# Patient Record
Sex: Female | Born: 1935 | Race: White | Hispanic: No | Smoking: Never smoker
Health system: Southern US, Community
[De-identification: ages and names within clinical notes are randomized; demographics above are authoritative.]

## PROBLEM LIST (undated history)

## (undated) DIAGNOSIS — E7800 Pure hypercholesterolemia, unspecified: Secondary | ICD-10-CM

## (undated) DIAGNOSIS — I10 Essential (primary) hypertension: Secondary | ICD-10-CM

## (undated) DIAGNOSIS — Z981 Arthrodesis status: Secondary | ICD-10-CM

## (undated) DIAGNOSIS — M719 Bursopathy, unspecified: Secondary | ICD-10-CM

## (undated) HISTORY — PX: TOTAL VAGINAL HYSTERECTOMY: SHX2548

## (undated) HISTORY — PX: APPENDECTOMY: SHX54

## (undated) HISTORY — PX: SPINAL FUSION: SHX223

## (undated) HISTORY — PX: CHOLECYSTECTOMY: SHX55

## (undated) HISTORY — PX: TONSILLECTOMY: SUR1361

---

## 2003-02-17 ENCOUNTER — Encounter: Payer: Self-pay | Admitting: Neurosurgery

## 2003-02-17 ENCOUNTER — Encounter: Admission: RE | Admit: 2003-02-17 | Discharge: 2003-02-17 | Payer: Self-pay | Admitting: Neurosurgery

## 2003-06-02 ENCOUNTER — Encounter: Admission: RE | Admit: 2003-06-02 | Discharge: 2003-06-02 | Payer: Self-pay | Admitting: Neurosurgery

## 2003-06-16 ENCOUNTER — Encounter: Admission: RE | Admit: 2003-06-16 | Discharge: 2003-06-16 | Payer: Self-pay | Admitting: Neurosurgery

## 2003-10-06 ENCOUNTER — Encounter: Admission: RE | Admit: 2003-10-06 | Discharge: 2003-10-06 | Payer: Self-pay | Admitting: Neurosurgery

## 2004-12-31 ENCOUNTER — Ambulatory Visit: Payer: Self-pay

## 2005-02-14 ENCOUNTER — Ambulatory Visit: Payer: Self-pay

## 2005-10-24 ENCOUNTER — Ambulatory Visit: Payer: Self-pay | Admitting: Ophthalmology

## 2005-10-29 ENCOUNTER — Ambulatory Visit: Payer: Self-pay | Admitting: Ophthalmology

## 2005-12-03 ENCOUNTER — Ambulatory Visit: Payer: Self-pay | Admitting: Gastroenterology

## 2006-01-01 ENCOUNTER — Ambulatory Visit: Payer: Self-pay

## 2007-01-12 ENCOUNTER — Ambulatory Visit: Payer: Self-pay

## 2008-01-27 ENCOUNTER — Encounter: Admission: RE | Admit: 2008-01-27 | Discharge: 2008-01-27 | Payer: Self-pay | Admitting: Neurosurgery

## 2008-02-01 ENCOUNTER — Ambulatory Visit: Payer: Self-pay

## 2008-03-03 ENCOUNTER — Inpatient Hospital Stay (HOSPITAL_COMMUNITY): Admission: RE | Admit: 2008-03-03 | Discharge: 2008-03-09 | Payer: Self-pay | Admitting: Neurosurgery

## 2008-03-24 ENCOUNTER — Inpatient Hospital Stay: Payer: Self-pay | Admitting: Internal Medicine

## 2008-05-19 ENCOUNTER — Emergency Department: Payer: Self-pay | Admitting: Emergency Medicine

## 2008-06-01 ENCOUNTER — Ambulatory Visit: Payer: Self-pay | Admitting: Neurosurgery

## 2008-07-18 ENCOUNTER — Ambulatory Visit: Payer: Self-pay | Admitting: Urology

## 2008-08-25 ENCOUNTER — Ambulatory Visit: Payer: Self-pay | Admitting: Neurosurgery

## 2009-02-16 ENCOUNTER — Ambulatory Visit: Payer: Self-pay

## 2009-04-19 ENCOUNTER — Encounter: Admission: RE | Admit: 2009-04-19 | Discharge: 2009-04-19 | Payer: Self-pay | Admitting: Neurosurgery

## 2009-05-14 ENCOUNTER — Ambulatory Visit: Payer: Self-pay | Admitting: Physical Medicine and Rehabilitation

## 2009-05-24 ENCOUNTER — Ambulatory Visit: Payer: Self-pay | Admitting: Specialist

## 2009-07-06 ENCOUNTER — Ambulatory Visit: Payer: Self-pay | Admitting: Ophthalmology

## 2009-07-16 ENCOUNTER — Ambulatory Visit: Payer: Self-pay | Admitting: Ophthalmology

## 2009-07-25 ENCOUNTER — Ambulatory Visit: Payer: Self-pay | Admitting: Unknown Physician Specialty

## 2009-07-30 ENCOUNTER — Ambulatory Visit: Payer: Self-pay | Admitting: Ophthalmology

## 2009-08-06 ENCOUNTER — Ambulatory Visit: Payer: Self-pay | Admitting: Unknown Physician Specialty

## 2010-04-11 ENCOUNTER — Ambulatory Visit: Payer: Self-pay | Admitting: Gastroenterology

## 2010-04-17 ENCOUNTER — Ambulatory Visit: Payer: Self-pay

## 2010-07-02 IMAGING — CR DG THORACOLUMBAR SPINE STANDING SCOLIOSIS
2 series · 2 of 2 positions shown · non-contrast
Comparison: None

CLINICAL DATA: Chronic mid to lower back pain 50 years.

THORACOLUMBAR SCOLIOSIS STUDY - STANDING VIEWS

[view not recorded (1 of 2)]
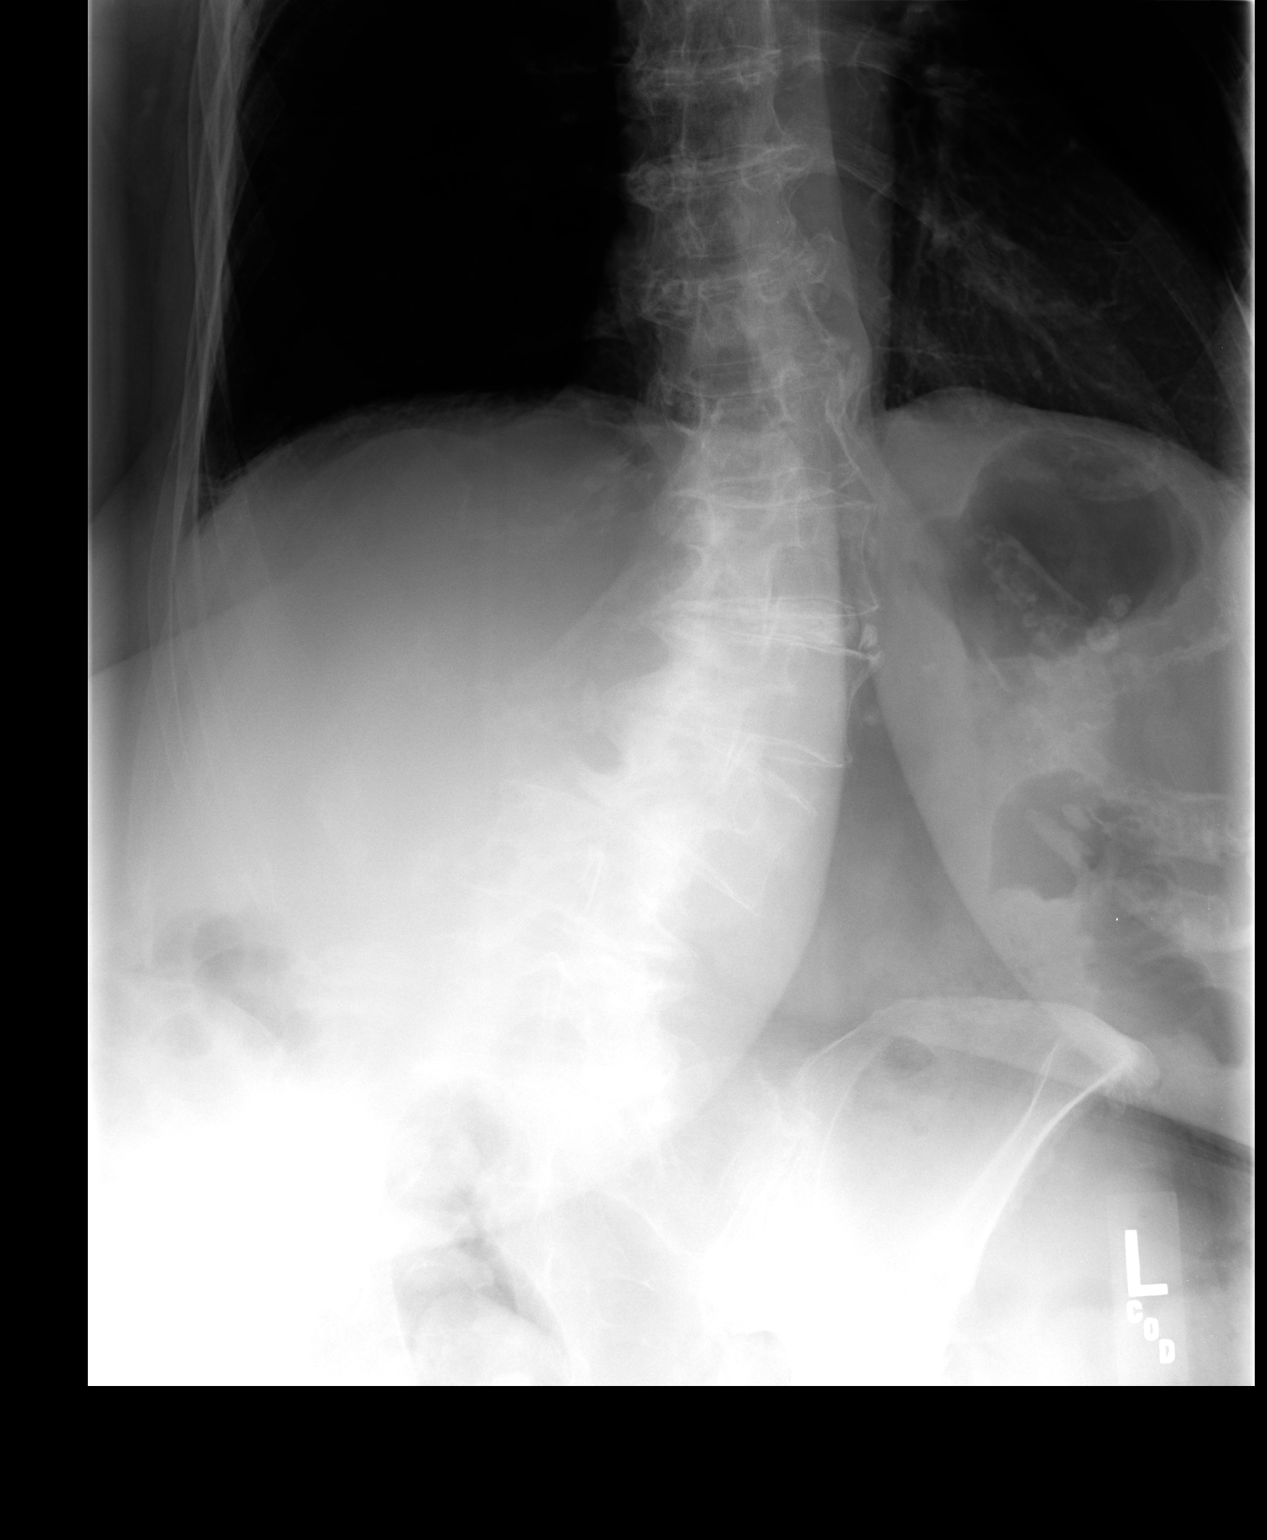

[view not recorded (2 of 2)]
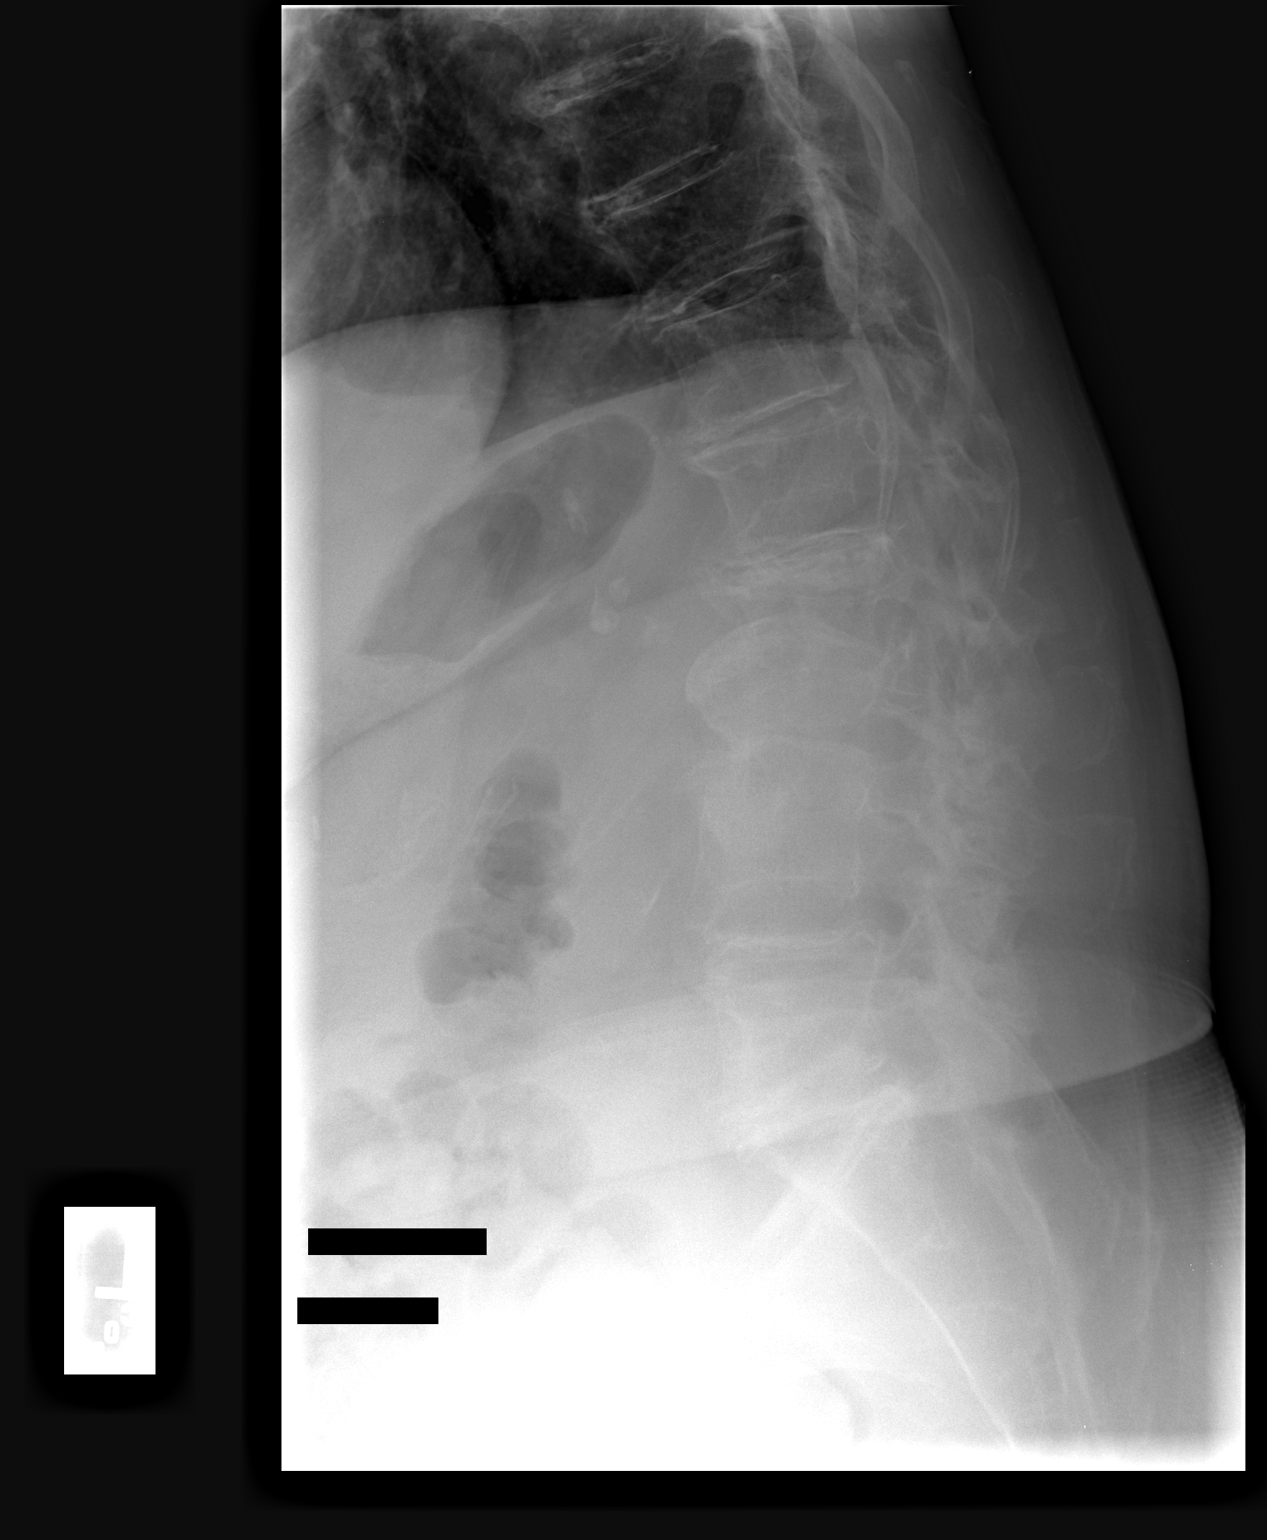

[2 of 2 positions shown; findings below may reference images not displayed]

FINDINGS: 26 degrees levo rotary scoliosis thoracolumbar spine
junction noted (measurement method of Lagueta measured from T11-L4).
Combination diffuse osteopenia and degenerative disc and vertebral
changes are seen throughout the visualized thoracolumbar spine.
Degenerative disc disease is maximal at L3-4 and right aspect of L2-
3.  No significant compression fracture deformity visualized.  6 mm
degenerative anterolisthesis is seen at L4-5.  Remaining vertebral
alignment is maintained.  No other significant abnormality
visualized.
IMPRESSION: 1.  26 degrees levo rotary scoliosis thoracolumbar spine junction.
2.  Diffuse osteopenia.
3.  Multilevel degenerative disc and vertebral changes with 6 mm
degenerative anterolisthesis L4-5.
4.  Degenerative disc disease maximal at L3-4 and L2-3.
5.  No acute findings.

## 2010-09-24 NOTE — Op Note (Signed)
Leslie Lamb, EDGIN          ACCOUNT NO.:  [id number]   MEDICAL RECORD NO.:  [id number]          PATIENT TYPE:  INP   LOCATION:  3019                         FACILITY:  [facility]   PHYSICIAN:  Kathaleen Maser. Pool, M.D.    DATE OF BIRTH:  1924-01-25   DATE OF PROCEDURE:  03/03/2008  DATE OF DISCHARGE:                               OPERATIVE REPORT   PREOPERATIVE DIAGNOSIS:  L2-3, L3-4 scoliosis with stenosis.   POSTOPERATIVE DIAGNOSIS:  L2-3, L3-4 scoliosis with stenosis.   PROCEDURE NAME:  L2-3, L3-4 decompressive laminectomy with L2, L3, and  L4 bilateral foraminotomies, more than it would be required for simple  interbody fusion alone.  L2-3, L3-4 posterior lumbar interbody fusion  utilizing tangent interbody allograft wedge, Telamon interbody PEEK  cage, and local autografting.  L2 through L4 posterolateral arthrodesis  utilizing segmental pedicle screw fixation and local autografting.  Repair of incidental durotomy.   SURGEON:  Kathaleen Maser. Pool, MD   ASSISTANT:  Donalee Citrin, MD   ANESTHESIA:  General endotracheal.   INDICATIONS:  Ms. Naser is an 77 year old female with a history of  severe back pain radiating to her lower extremities.  Workup  demonstrates evidence of an idiopathic thoracolumbar scoliosis with  progressive disk degeneration and subsequent decompensation of her curve  at L2-3 and L3-4.  The patient has marked lateral listhesis at L2-L3 and  L3-4.  We discussed options for management.  We decided to do a short  segment fixation from L2 through L4 for hope in improvement of her  symptoms.   OPERATIVE NOTE:  The patient was brought to the operating room, placed  on the operating table in a supine position.  After adequate level of  anesthesia was achieved, the patient was placed prone onto a Wilson  frame, appropriately padded.  The patient's lumbar region prepped and  draped sterilely.  A #10 blade was used to make a linear skin incision  extending from L2 down  to L4.  This was carried down sharply in the  midline.  Subperiosteal dissection was then performed exposing the  lamina and facet joints of L2, L3, and L4, as well as the transverse  processes.  Deep self-retaining retractor was placed.  Intraoperative  fluoroscopy was used, levels were confirmed.  Decompressive  laminectomies were then performed using Leksell rongeurs, Kerrison  rongeurs, and high-speed drill to remove the entire lamina of L3, entire  lamina of L4, inferior facets of L3 bilaterally, inferior facets of L4  bilaterally, and superior facets of L3 and L4 bilaterally.  During the  facetectomy on the left side at L3-4, the inferior facet of L3 was  tightly adherent to the underlying dura.  Attempts to dissect this free  were met with a incidental durotomy.  The bone was eventually dissected  free.  The dural laceration was identified.  There was no evidence of  injury to underlying cauda equina.  The dural laceration was then  repaired in a simple running fashion using a 5-0 Prolene suture.  This  achieved watertight closure without difficulty.  Attention was then  placed to interbody fusion.  Epidural venous  plexus was coagulated and  cut.  Starting first at L3-4, disk space was then incised and bilateral  diskectomies were performed.  Disk space was then subsequently dilated  up to 9 mm with a 9-mm distractor left in the patient's right side.  Thecal sac and nerve roots were protected on the left side.  Disk space  was then reamed and then cut with an 8-mm tangent instrument.  Soft  tissues were then removed from the interspace.  An 8 x 26 mm tangent  wedge was then impacted into place and recessed approximately 2 mm from  the posterior cortical margin.  Distractor was removed from the  patient's left side.  Thecal sac and nerve roots were protected on the  left side.  Once again, 8-mm tangent instruments were then used to  prepare the interspace.  Disk space was then  further curettaged.  Morselized autograft was then packed into interspace.  An 8 x 22 mm  Telamon cage packed with morselized autograft was then impacted into  place and recessed approximately 2 mm from the posterior cortical margin  at L3.  Procedure was then repeated at L2-3 again without complications.  Pedicles at L2, L3, and L4 were then identified using surface landmarks  and intraoperative fluoroscopy.  Superficial bone overlying the pedicles  was then removed using a high-speed drill.  Each pedicle was then probed  using pedicle awl.  Pedicle awl track was then tapped with 5.25-mm screw  tap.  Each screw tap hole was then probed and found to be solid bone.  A  6.75 x 40 mm screws were placed bilaterally at L2; 6.75 x 45 mm screws  placed bilaterally at L3 and L4.  Transverse processes of L2, L3, and L4  were then decorticated bilaterally.  Morselized autograft was packed  posterolaterally for later fusion.  Short-segment titanium rods were  then placed through the screw heads from L2 through L4.  Locking caps  were then placed over the screw heads.  The locking caps were then  engaged with the construct under compression.  Transverse connector was  also placed.  Final images revealed good position of bone grafts and  hardware at proper operative level with normal alignment of spine.  Wound was then irrigated with antibiotic solution.  Gelfoam was placed  laterally in the gutters.  DuraSeal was then sprayed over the dural  repair.  A medium Hemovac drain was left in the epidural space.  Wound  was then closed in layers with Vicryl suture.  Steri-Strips and sterile  dressing were applied.  There were no complications.  The patient  tolerated the procedure well, and she returned to the recovery room for  postoperative care.           ______________________________  Kathaleen Maser. Pool, M.D.     HAP/MEDQ  D:  03/03/2008  T:  03/04/2008  Job:  [id number]

## 2010-09-27 NOTE — Discharge Summary (Signed)
Leslie Lamb, Leslie Lamb          ACCOUNT NO.:  [id number]   MEDICAL RECORD NO.:  [id number]          PATIENT TYPE:  INP   LOCATION:  3019                         FACILITY:  [facility]   PHYSICIAN:  Kathaleen Maser. Pool, M.D.    DATE OF BIRTH:  Feb 01, 1924   DATE OF ADMISSION:  03/03/2008  DATE OF DISCHARGE:  03/09/2008                               DISCHARGE SUMMARY   FINAL DIAGNOSIS:  L2-3 and L3-4 degenerative scoliosis with severe  stenosis and intractable pain.   HISTORY OF PRESENT ILLNESS:  Ms. Manfredi is an 77 year old female with  history of severe back pain which is becoming increasingly disabling.  Workup demonstrates evidence of a decompensating degenerative scoliosis.  The patient presents now for two-level lumbar decompression and fusion  with a sensation in hopes of improving her symptoms.   HOSPITAL COURSE:  The patient underwent an uncomplicated L2-3 and L3-4  decompression and fusion with instrumentation.  Postoperatively, she did  well.  Her back and lower extremity pain were improved postoperatively.  She was somewhat slow to mobilize secondary to some transient  hypertension, which responded to fluid in time.  Overall, she began to  mobilize slowly with the aid of physical and occupational therapy.  She  had no neurological deficits.  Her pain was well controlled.  Her wound  is healing well.   Condition at discharge is improved.  She will be discharged home.  She  will follow up in my office in 1 week.           ______________________________  Kathaleen Maser. Pool, M.D.     HAP/MEDQ  D:  04/18/2008  T:  04/19/2008  Job:  [id number]

## 2011-02-11 LAB — DIFFERENTIAL
Basophils Relative: 1
Lymphs Abs: 1.3
Monocytes Relative: 8
Neutro Abs: 4.1
Neutrophils Relative %: 66

## 2011-02-11 LAB — CBC
MCHC: 33.5
Platelets: 221
RBC: 4.19
RDW: 12.6
WBC: 6.2

## 2011-02-11 LAB — BASIC METABOLIC PANEL
BUN: 16
CO2: 29
Chloride: 101
GFR calc non Af Amer: 54 — ABNORMAL LOW

## 2011-02-11 LAB — TYPE AND SCREEN: Antibody Screen: NEGATIVE

## 2011-03-19 ENCOUNTER — Emergency Department: Payer: Self-pay | Admitting: *Deleted

## 2011-03-26 ENCOUNTER — Emergency Department: Payer: Self-pay | Admitting: Unknown Physician Specialty

## 2011-03-29 ENCOUNTER — Emergency Department: Payer: Self-pay | Admitting: Internal Medicine

## 2011-05-29 ENCOUNTER — Ambulatory Visit: Payer: Self-pay

## 2011-09-23 IMAGING — CR DG MYELOGRAM LUMBAR
4 series · 4 of 4 positions shown · IV contrast (omnipaque)
Comparison: MRI lumbar spine 01/27/2008

MYELOGRAM  INJECTION
TECHNIQUE: Informed consent was obtained from the patient prior to
the procedure, including potential complications of headache,
allergy, infection and pain. Specific instructions were given
regarding 24 hour bedrest post procedure to prevent post-LP
headache.  A timeout procedure was performed.  With the patient
prone, the lower back was prepped with Betadine.  1% Lidocaine was
used for local anesthesia.  Lumbar puncture was performed by the
radiologist at the L2-3 level using a 22 gauge needle with return
of clear CSF.  15 cc of Omnipaque 180 was injected into the
subarachnoid space .
CLINICAL DATA: Low back pain, bilateral leg pain.
TECHNIQUE: Multidetector CT imaging of the lumbar spine was
performed following myelography.  Multiplanar CT image
reconstructions were also generated.

[view not recorded (1 of 4)]
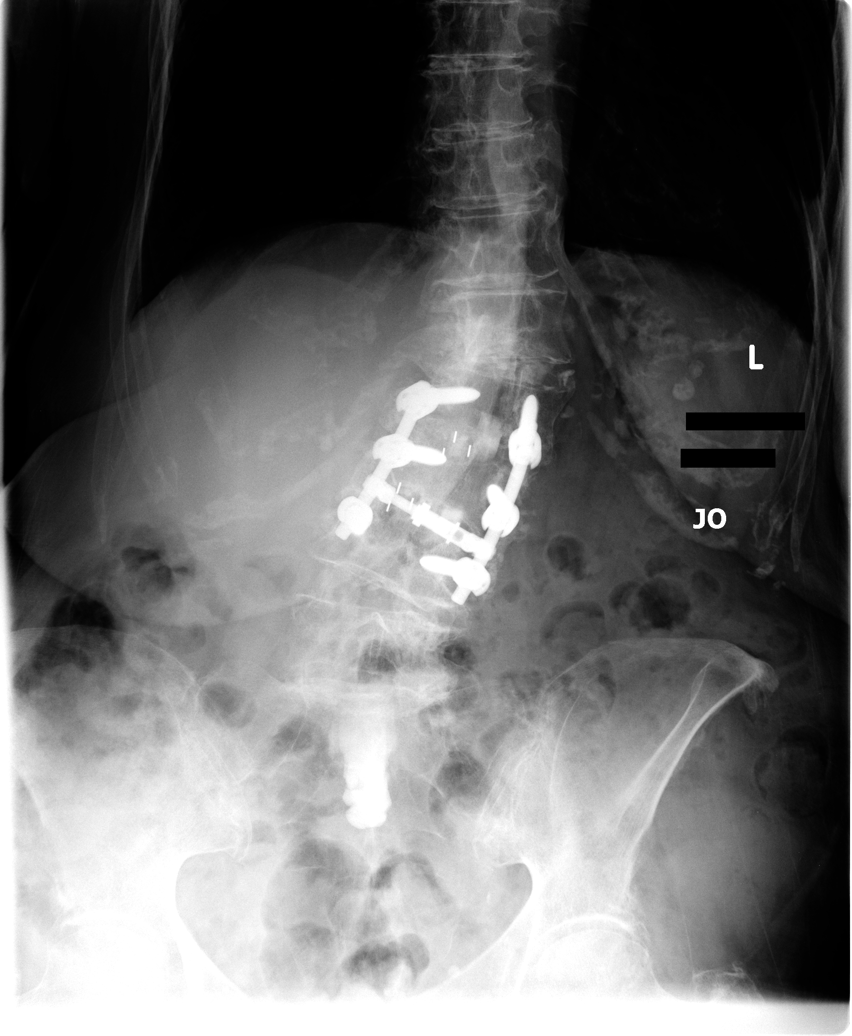

[view not recorded (2 of 4)]
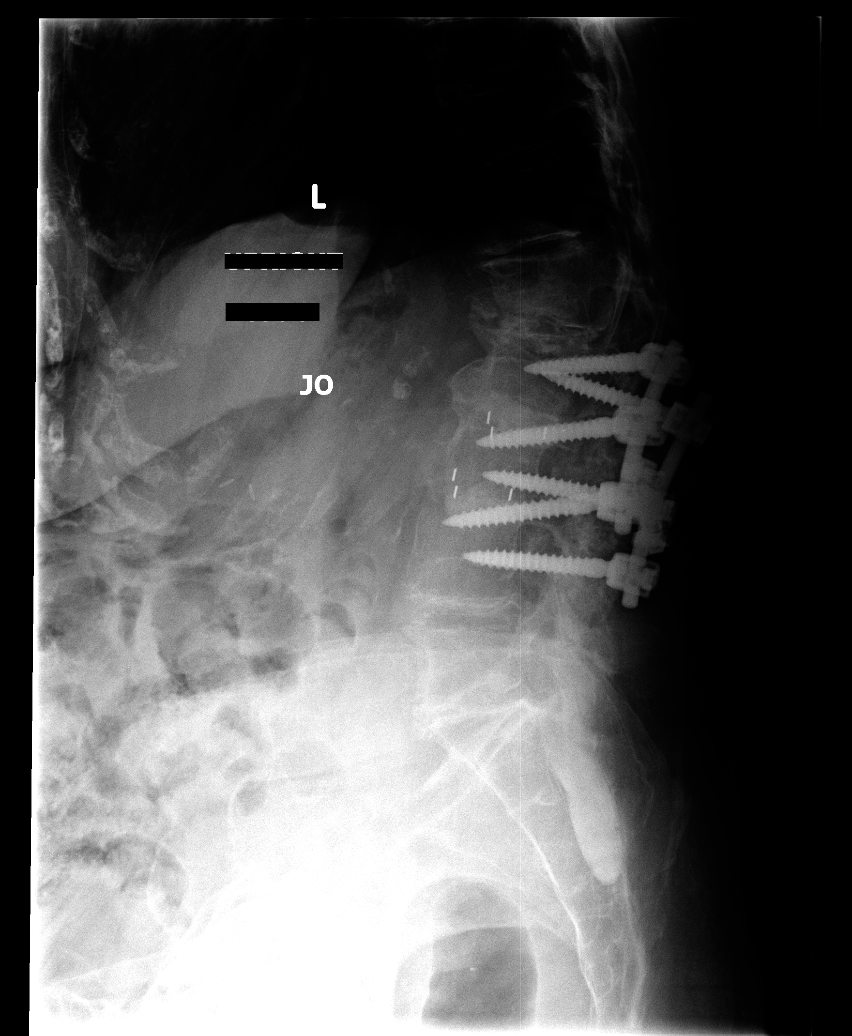

[view not recorded (3 of 4)]
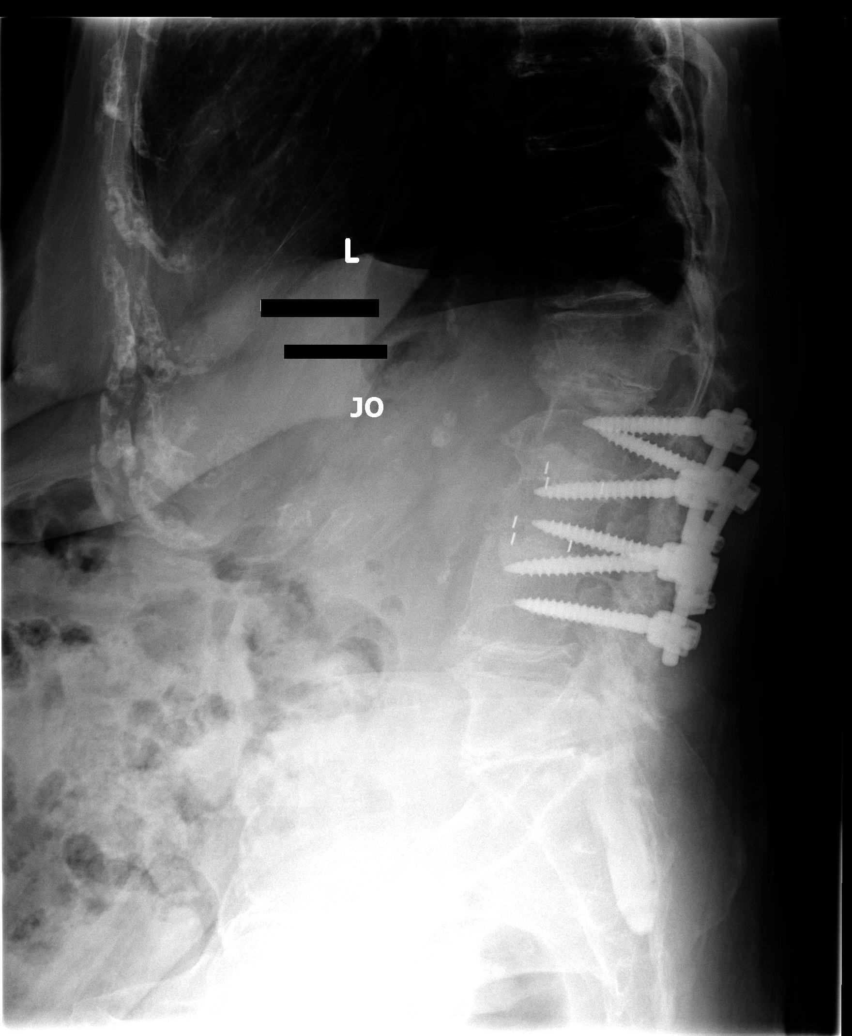

[view not recorded (4 of 4)]
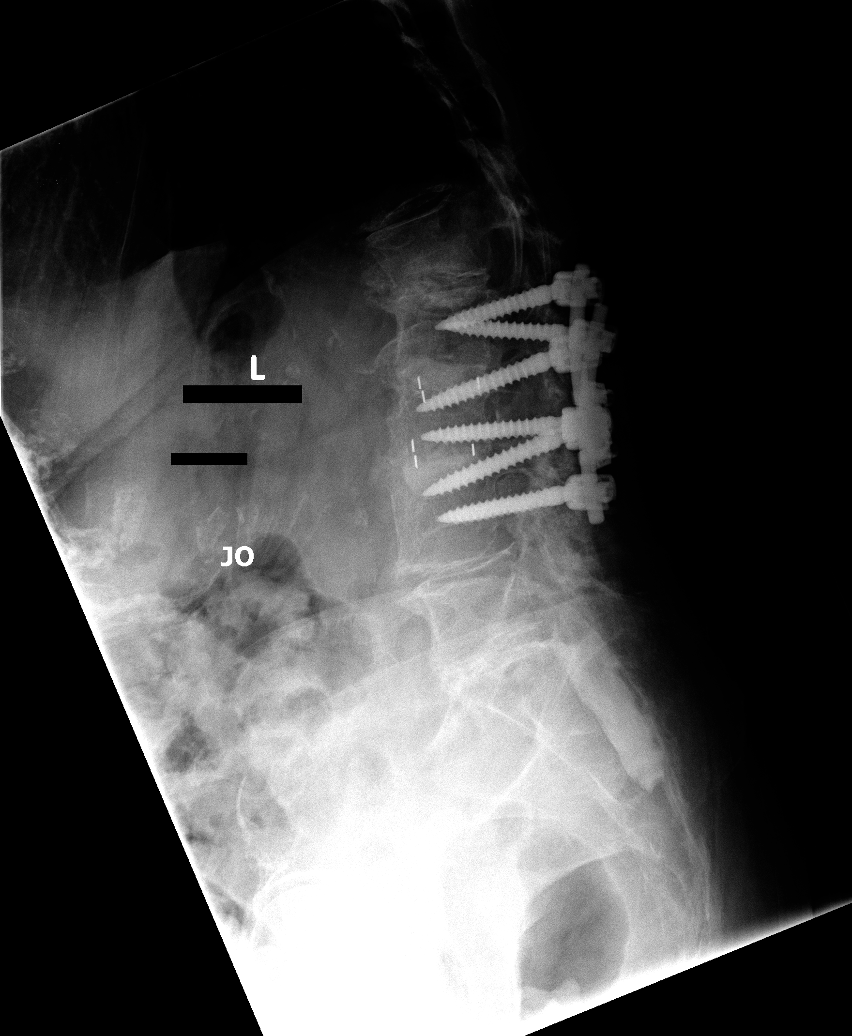

[4 of 4 positions shown; findings below may reference images not displayed]

IMPRESSION: Successful injection of  intrathecal contrast for myelography.

MYELOGRAM LUMBAR
FINDINGS: The patient has undergone previous L2-3 and L3-4
decompression with posterolateral fusion using pedicle screw and
rod instrumentation.  There is improved scoliosis following the
placement of interbody spacers at the L2-3 and L3-4 levels.  The
hardware is intact.  There is no significant stenosis.  No definite
nerve root cut off is seen.  There is suboptimal opacification of
the subarachnoid space due to a extremely patulous lower
lumbosacral spinal sac where much of the contrast accumulates
opposite the S2 and S3 region. No dynamic instability on standing
flexion/extension views.

Fluoroscopy Time: 1.54 minutes
IMPRESSION: As above

CT MYELOGRAPHY LUMBAR SPINE
FINDINGS: No prevertebral or paraspinous masses.

L1-2: Calcified shallow left paracentral protrusion with vacuum
disc phenomenon.  Mild facet arthropathy.  No neural compression.

L2-3: Adequate posterior decompression.  Hardware intact and
appropriately placed.  Mild clumping of the nerve roots could
suggest arachnoiditis.  Interbody spacers satisfactorily
positioned.  There appears to be solid bony bridging across the
interspace.

L3-4: Adequate posterior decompression.  Hardware intact and
appropriately placed.  There appears to be solid interbody fusion.

L4-5: Asymmetric lateral recess stenosis related to extraforaminal
spurring on the left, asymmetric facet arthropathy, and shallow
left paracentral and foraminal protrusion.  Left L4 and left L5
nerve root encroachment are possible. Adequate posterior
decompression.

L5-S1: Extraforaminal spurring on the left results in functional
fusion.  There is mild disc space narrowing.  No definite neural
compression.  Mild facet arthropathy.  Adequate posterior
decompression.

Compared to the prior MR, the findings at L3-4 and L2-3 are much
improved.
IMPRESSION: Satisfactory postoperative appearance status post L2-L4 fusion with
instrumentation; there appears to be solid interbody fusion and
adequate neural decompression. Much improved compared to the
preoperative state, and no evidence for hardware failure or
displacement. There is also improved scoliosis compared with the
preoperative study.

Some clumping of the intradural nerve roots opposite the L2-3 disc
space could indicate arachnoiditis.

Stable asymmetric lateral recess stenosis and foraminal narrowing,
multifactorial, at L4-5, left.

## 2012-06-03 ENCOUNTER — Ambulatory Visit: Payer: Self-pay

## 2013-06-23 ENCOUNTER — Ambulatory Visit: Payer: Self-pay

## 2014-03-31 DIAGNOSIS — N3941 Urge incontinence: Secondary | ICD-10-CM | POA: Insufficient documentation

## 2015-03-02 ENCOUNTER — Emergency Department
Admission: EM | Admit: 2015-03-02 | Discharge: 2015-03-02 | Disposition: A | Discharge status: Home / Self Care | Payer: Medicare Other | Attending: Student | Admitting: Student

## 2015-03-02 ENCOUNTER — Emergency Department: Payer: Medicare Other

## 2015-03-02 ENCOUNTER — Encounter: Payer: Self-pay | Admitting: Emergency Medicine

## 2015-03-02 DIAGNOSIS — S3992XA Unspecified injury of lower back, initial encounter: Secondary | ICD-10-CM | POA: Diagnosis present

## 2015-03-02 DIAGNOSIS — M533 Sacrococcygeal disorders, not elsewhere classified: Secondary | ICD-10-CM

## 2015-03-02 DIAGNOSIS — Y9389 Activity, other specified: Secondary | ICD-10-CM | POA: Diagnosis not present

## 2015-03-02 DIAGNOSIS — R52 Pain, unspecified: Secondary | ICD-10-CM

## 2015-03-02 DIAGNOSIS — S34139A Unspecified injury to sacral spinal cord, initial encounter: Secondary | ICD-10-CM | POA: Insufficient documentation

## 2015-03-02 DIAGNOSIS — W050XXA Fall from non-moving wheelchair, initial encounter: Secondary | ICD-10-CM | POA: Insufficient documentation

## 2015-03-02 DIAGNOSIS — Y9289 Other specified places as the place of occurrence of the external cause: Secondary | ICD-10-CM | POA: Diagnosis not present

## 2015-03-02 DIAGNOSIS — I10 Essential (primary) hypertension: Secondary | ICD-10-CM | POA: Diagnosis not present

## 2015-03-02 DIAGNOSIS — Y998 Other external cause status: Secondary | ICD-10-CM | POA: Diagnosis not present

## 2015-03-02 DIAGNOSIS — M5136 Other intervertebral disc degeneration, lumbar region: Secondary | ICD-10-CM | POA: Diagnosis not present

## 2015-03-02 DIAGNOSIS — M810 Age-related osteoporosis without current pathological fracture: Secondary | ICD-10-CM | POA: Insufficient documentation

## 2015-03-02 HISTORY — DX: Arthrodesis status: Z98.1

## 2015-03-02 HISTORY — DX: Essential (primary) hypertension: I10

## 2015-03-02 HISTORY — DX: Pure hypercholesterolemia, unspecified: E78.00

## 2015-03-02 HISTORY — DX: Bursopathy, unspecified: M71.9

## 2015-03-02 LAB — URINALYSIS COMPLETE WITH MICROSCOPIC (ARMC ONLY)
BILIRUBIN URINE: NEGATIVE
GLUCOSE, UA: NEGATIVE mg/dL
HGB URINE DIPSTICK: NEGATIVE
KETONES UR: NEGATIVE mg/dL
LEUKOCYTES UA: NEGATIVE
NITRITE: POSITIVE — AB
PH: 9 — AB (ref 5.0–8.0)
Protein, ur: NEGATIVE mg/dL
SPECIFIC GRAVITY, URINE: 1.008 (ref 1.005–1.030)
Squamous Epithelial / LPF: NONE SEEN

## 2015-03-02 MED ORDER — OXYCODONE-ACETAMINOPHEN 5-325 MG PO TABS
1.0000 | ORAL_TABLET | Freq: Once | ORAL | Status: AC
  Administered 2015-03-02: 1 via ORAL
  Filled 2015-03-02: qty 1

## 2015-03-02 MED ORDER — ONDANSETRON 4 MG PO TBDP
ORAL_TABLET | ORAL | Status: AC
  Administered 2015-03-02: 4 mg via ORAL
  Filled 2015-03-02: qty 1

## 2015-03-02 MED ORDER — ONDANSETRON 4 MG PO TBDP
4.0000 mg | ORAL_TABLET | Freq: Once | ORAL | Status: AC
  Administered 2015-03-02: 4 mg via ORAL

## 2015-03-02 NOTE — ED Notes (Signed)
Pt presents to ED via EMS from personal home with c/o of fall, occurring approximately x2 weeks ago. EMS states pt experienced sx and decided to NOT seek medical treatment at the time of incident. EMS reports pt has seen PCP x1 week for a bursitis procedure and attempted to notify PCP, without success. Pt states pain is increasing throughout this week and has ambulating with walker more than usual. Pt rates pain an 8/10, localized to lower back region. Pt alert and oriented x4.

## 2015-03-02 NOTE — ED Provider Notes (Signed)
CSN: [id number]     Arrival date & time 03/02/15  1931 History   First MD Initiated Contact with Patient 03/02/15 2004     Chief Complaint  Patient presents with  . Fall     (Consider location/radiation/quality/duration/timing/severity/associated sxs/prior Treatment) HPI  81 year old female presents to the emergency department for evaluation of lower back pain. She has a history of osteoporosis, L2-4 fusion performed years ago in [place], severe lumbar spondylosis with degenerative disc disease. Patient states she's had sacral pain along the midline of the sacrum for the last 3 weeks, pain began after sliding out of her wheelchair. On 02/20/2015, while receiving an x-ray of her lower back at the orthopedics office, she felt a pop in her lower back. Pain is been increased since. Patient describes moderate to severe lower back pain in the lumbosacral junction that does not radiate down the lower extremities. No numbness tingling or weakness in the lower extremities. She ambulates with a walker. She currently is prescribed oxycodone 10-3 25, one half tablet daily at bedtime when necessary pain. She also takes prednisone 20 mg daily. Patient denies any chest pain, shortness of breath abdominal pain.   Past Medical History  Diagnosis Date  . Hypertension   . Bursitis   . Hx of spinal fusion   . Hypercholesteremia    Past Surgical History  Procedure Laterality Date  . Spinal fusion    . Total vaginal hysterectomy    . Cholecystectomy    . Appendectomy    . Tonsillectomy     No family history on file. Social History  Substance Use Topics  . Smoking status: Never Smoker   . Smokeless tobacco: None  . Alcohol Use: Yes   OB History    No data available     Review of Systems  Constitutional: Negative for fever, chills, activity change and fatigue.  HENT: Negative for congestion, sinus pressure and sore throat.   Eyes: Negative for visual disturbance.  Respiratory: Negative for  cough, chest tightness and shortness of breath.   Cardiovascular: Negative for chest pain and leg swelling.  Gastrointestinal: Negative for nausea, vomiting, abdominal pain and diarrhea.  Genitourinary: Negative for dysuria.  Musculoskeletal: Positive for back pain (Lumbosacral). Negative for arthralgias and gait problem.  Skin: Negative for rash.  Neurological: Negative for weakness, numbness and headaches.  Hematological: Negative for adenopathy.  Psychiatric/Behavioral: Negative for behavioral problems, confusion and agitation.      Allergies  Ciprofloxacin and Sulfa antibiotics  Home Medications   Prior to Admission medications   Not on File   BP 199/92 mmHg  Pulse 74  Temp(Src) 98.5 F (36.9 C) (Oral)  Resp 18  Ht 4\' 11"  (1.499 m)  Wt 162 lb (73.483 kg)  BMI 32.70 kg/m2  SpO2 95% Physical Exam  Constitutional: She is oriented to person, place, and time. She appears well-developed and well-nourished. No distress.  HENT:  Head: Normocephalic and atraumatic.  Mouth/Throat: Oropharynx is clear and moist.  Eyes: EOM are normal. Pupils are equal, round, and reactive to light. Right eye exhibits no discharge. Left eye exhibits no discharge.  Neck: Normal range of motion. Neck supple.  Cardiovascular: Normal rate, regular rhythm and intact distal pulses.   Pulmonary/Chest: Effort normal and breath sounds normal. No respiratory distress. She exhibits no tenderness.  Abdominal: Soft. She exhibits no distension. There is no tenderness.  Musculoskeletal:  Examination of the lumbar spine shows patient has no spinous process tenderness. There is no paravertebral muscle tenderness. She is  tender along the lower sacral region and bilateral SI joints. No skin breakdown noted. She has full range of motion of the hips with no discomfort. She has no swelling or edema throughout the lower extremities. Full range of motion of the knee and ankles with no discomfort.  Neurological: She is  alert and oriented to person, place, and time. She has normal reflexes.  Skin: Skin is warm and dry.  Psychiatric: She has a normal mood and affect. Her behavior is normal. Thought content normal.    ED Course  Procedures (including critical care time) Labs Review Labs Reviewed  URINALYSIS COMPLETEWITH MICROSCOPIC ([facility] ONLY) - Abnormal; Notable for the following:    Color, Urine YELLOW (*)    APPearance CLEAR (*)    pH 9.0 (*)    Nitrite POSITIVE (*)    Bacteria, UA FEW (*)    All other components within normal limits    Imaging Review Dg Lumbar Spine Complete  03/02/2015  CLINICAL DATA:  Low back pain after fall 2 weeks ago. EXAM: LUMBAR SPINE - COMPLETE 4+ VIEW COMPARISON:  CT scan of August 25, 2008. FINDINGS: Status post surgical posterior fusion of L2, L3 and L4 with bilateral intrapedicular screw placement. No fracture or spondylolisthesis is noted. Severe degenerative disc disease is noted throughout the lumbar spine. IMPRESSION: Postsurgical and degenerative changes as described above. No acute abnormality seen in the lumbar spine. Electronically Signed   By: Lupita Raider, M.D.   On: 03/02/2015 21:38   Dg Sacrum/coccyx  03/02/2015  CLINICAL DATA:  Larey Seat 2 weeks ago, progressively worsening sacral pain EXAM: SACRUM AND COCCYX - 2+ VIEW COMPARISON:  None FINDINGS: Marked osseous demineralization. Prior lumbar fusion. SI joints symmetric. Sacral foramina poorly visualized. No definite sacrococcygeal fracture identified though assessment is limited by the degree of demineralization. IMPRESSION: No definite sacrococcygeal abnormalities identified as above. Marked osseous demineralization. Electronically Signed   By: Ulyses Southward M.D.   On: 03/02/2015 21:37   I have personally reviewed and evaluated these images and lab results as part of my medical decision-making.   EKG Interpretation None      MDM   Final diagnoses:  Pain  Sacral pain  Lumbar degenerative disc disease   Osteoporosis    81 year old female with acute on chronic lower back pain. Patient has a history of osteoporosis and lumbar spondylosis. X-ray show no evidence of acute compression deformity. She may have sacral insufficiency fracture that did not show up on plain films. She has no signs of radicular symptoms or any neurological deficits. Patient was treated in the hospital with oxycodone 5 mg by mouth 1. This did significantly improve her pain. Patient will continue with oxycodone 10-325, half a tablet daily at bedtime, she can increase to half a tablet twice a day if needed. She will follow-up with rheumatologist or spine surgeon first of next week.   Evon Slack, PA-C 03/02/15 [id number]  Gayla Doss, MD 03/03/15 [phone]

## 2015-03-02 NOTE — Discharge Instructions (Signed)
Degenerative Disk Disease Degenerative disk disease is a condition caused by the changes that occur in spinal disks as you grow older. Spinal disks are soft and compressible disks located between the bones of your spine (vertebrae). These disks act like shock absorbers. Degenerative disk disease can affect the whole spine. However, the neck and lower back are most commonly affected. Many changes can occur in the spinal disks with aging, such as:  The spinal disks may dry and shrink.  Small tears may occur in the tough, outer covering of the disk (annulus).  The disk space may become smaller due to loss of water.  Abnormal growths in the bone (spurs) may occur. This can put pressure on the nerve roots exiting the spinal canal, causing pain.  The spinal canal may become narrowed. RISK FACTORS   Being overweight.  Having a family history of degenerative disk disease.  Smoking.  There is increased risk if you are doing heavy lifting or have a sudden injury. SIGNS AND SYMPTOMS  Symptoms vary from person to person and may include:  Pain that varies in intensity. Some people have no pain, while others have severe pain. The location of the pain depends on the part of your backbone that is affected.  You will have neck or arm pain if a disk in the neck area is affected.  You will have pain in your back, buttocks, or legs if a disk in the lower back is affected.  Pain that becomes worse while bending, reaching up, or with twisting movements.  Pain that may start gradually and then get worse as time passes. It may also start after a major or minor injury.  Numbness or tingling in the arms or legs. DIAGNOSIS  Your health care provider will ask you about your symptoms and about activities or habits that may cause the pain. He or she may also ask about any injuries, diseases, or treatments you have had. Your health care provider will examine you to check for the range of movement that is  possible in the affected area, to check for strength in your extremities, and to check for sensation in the areas of the arms and legs supplied by different nerve roots. You may also have:   An X-ray of the spine.  Other imaging tests, such as MRI. TREATMENT  Your health care provider will advise you on the best plan for treatment. Treatment may include:  Medicines.  Rehabilitation exercises. HOME CARE INSTRUCTIONS   Follow proper lifting and walking techniques as advised by your health care provider.  Maintain good posture.  Exercise regularly as advised by your health care provider.  Perform relaxation exercises.  Change your sitting, standing, and sleeping habits as advised by your health care provider.  Change positions frequently.  Lose weight or maintain a healthy weight as advised by your health care provider.  Do not use any tobacco products, including cigarettes, chewing tobacco, or electronic cigarettes. If you need help quitting, ask your health care provider.  Wear supportive footwear.  Take medicines only as directed by your health care provider. SEEK MEDICAL CARE IF:   Your pain does not go away within 1-4 weeks.  You have significant appetite or weight loss. SEEK IMMEDIATE MEDICAL CARE IF:   Your pain is severe.  You notice weakness in your arms, hands, or legs.  You begin to lose control of your bladder or bowel movements.  You have fevers or night sweats. MAKE SURE YOU:   Understand these  instructions.  Will watch your condition.  Will get help right away if you are not doing well or get worse.   This information is not intended to replace advice given to you by your health care provider. Make sure you discuss any questions you have with your health care provider.   Document Released: 02/23/2007 Document Revised: 05/19/2014 Document Reviewed: 08/30/2013 Elsevier Interactive Patient Education 2016 Elsevier Inc.  Cryotherapy Cryotherapy is  when you put ice on your injury. Ice helps lessen pain and puffiness (swelling) after an injury. Ice works the best when you start using it in the first 24 to 48 hours after an injury. HOME CARE  Put a dry or damp towel between the ice pack and your skin.  You may press gently on the ice pack.  Leave the ice on for no more than 10 to 20 minutes at a time.  Check your skin after 5 minutes to make sure your skin is okay.  Rest at least 20 minutes between ice pack uses.  Stop using ice when your skin loses feeling (numbness).  Do not use ice on someone who cannot tell you when it hurts. This includes small children and people with memory problems (dementia). GET HELP RIGHT AWAY IF:  You have white spots on your skin.  Your skin turns blue or pale.  Your skin feels waxy or hard.  Your puffiness gets worse. MAKE SURE YOU:   Understand these instructions.  Will watch your condition.  Will get help right away if you are not doing well or get worse.   This information is not intended to replace advice given to you by your health care provider. Make sure you discuss any questions you have with your health care provider.   Document Released: 10/15/2007 Document Revised: 07/21/2011 Document Reviewed: 12/19/2010 Elsevier Interactive Patient Education [employer]

## 2015-03-02 NOTE — ED Notes (Signed)
Patient with no complaints at this time. Respirations even and unlabored. Skin warm/dry. Discharge instructions reviewed with patient at this time. Patient given opportunity to voice concerns/ask questions. Patient discharged at this time and left Emergency Department, via wheelchair.   

## 2015-03-12 ENCOUNTER — Emergency Department
Admission: EM | Admit: 2015-03-12 | Discharge: 2015-03-12 | Disposition: A | Discharge status: Home / Self Care | Payer: Medicare Other | Attending: Emergency Medicine | Admitting: Emergency Medicine

## 2015-03-12 ENCOUNTER — Emergency Department: Payer: Medicare Other

## 2015-03-12 ENCOUNTER — Encounter: Payer: Self-pay | Admitting: Emergency Medicine

## 2015-03-12 ENCOUNTER — Encounter
Admission: RE | Admit: 2015-03-12 | Discharge: 2015-03-12 | Disposition: A | Discharge status: Home / Self Care | Payer: Medicare Other | Source: Ambulatory Visit | Attending: Internal Medicine | Admitting: Internal Medicine

## 2015-03-12 DIAGNOSIS — S3992XA Unspecified injury of lower back, initial encounter: Secondary | ICD-10-CM | POA: Diagnosis present

## 2015-03-12 DIAGNOSIS — R269 Unspecified abnormalities of gait and mobility: Secondary | ICD-10-CM | POA: Insufficient documentation

## 2015-03-12 DIAGNOSIS — Y9389 Activity, other specified: Secondary | ICD-10-CM | POA: Insufficient documentation

## 2015-03-12 DIAGNOSIS — I10 Essential (primary) hypertension: Secondary | ICD-10-CM | POA: Diagnosis not present

## 2015-03-12 DIAGNOSIS — Z791 Long term (current) use of non-steroidal anti-inflammatories (NSAID): Secondary | ICD-10-CM | POA: Diagnosis not present

## 2015-03-12 DIAGNOSIS — S32110A Nondisplaced Zone I fracture of sacrum, initial encounter for closed fracture: Secondary | ICD-10-CM | POA: Insufficient documentation

## 2015-03-12 DIAGNOSIS — Z79899 Other long term (current) drug therapy: Secondary | ICD-10-CM | POA: Diagnosis not present

## 2015-03-12 DIAGNOSIS — Z7982 Long term (current) use of aspirin: Secondary | ICD-10-CM | POA: Insufficient documentation

## 2015-03-12 DIAGNOSIS — Y9289 Other specified places as the place of occurrence of the external cause: Secondary | ICD-10-CM | POA: Insufficient documentation

## 2015-03-12 DIAGNOSIS — Z7952 Long term (current) use of systemic steroids: Secondary | ICD-10-CM | POA: Diagnosis not present

## 2015-03-12 DIAGNOSIS — S3210XA Unspecified fracture of sacrum, initial encounter for closed fracture: Secondary | ICD-10-CM

## 2015-03-12 DIAGNOSIS — Y998 Other external cause status: Secondary | ICD-10-CM | POA: Diagnosis not present

## 2015-03-12 DIAGNOSIS — R262 Difficulty in walking, not elsewhere classified: Secondary | ICD-10-CM

## 2015-03-12 LAB — URINALYSIS COMPLETE WITH MICROSCOPIC (ARMC ONLY)
BILIRUBIN URINE: NEGATIVE
GLUCOSE, UA: NEGATIVE mg/dL
HGB URINE DIPSTICK: NEGATIVE
NITRITE: NEGATIVE
Protein, ur: 30 mg/dL — AB
SPECIFIC GRAVITY, URINE: 1.013 (ref 1.005–1.030)
pH: 6 (ref 5.0–8.0)

## 2015-03-12 MED ORDER — OXYCODONE HCL 5 MG PO TABS
5.0000 mg | ORAL_TABLET | Freq: Once | ORAL | Status: AC
  Administered 2015-03-12: 5 mg via ORAL
  Filled 2015-03-12: qty 1

## 2015-03-12 MED ORDER — ONDANSETRON HCL 4 MG/2ML IJ SOLN
INTRAMUSCULAR | Status: AC
  Filled 2015-03-12: qty 2

## 2015-03-12 MED ORDER — OXYCODONE HCL 5 MG PO TABS: 5.0000 mg | ORAL_TABLET | Freq: Three times a day (TID) | ORAL | Status: AC | PRN

## 2015-03-12 NOTE — ED Notes (Signed)
Family at bedside. 

## 2015-03-12 NOTE — ED Notes (Signed)
Called report to receiving  facility RN, answered all questions to the best of my knowledge. After 13 minutes of questioning, I KINDLY explained to the RN that I was an emergency room nurse and had told her all of the information that was of extreme importance and that I had another pt being wheeled into one of my rooms. I was very calm, when asked what meds she was being sent out on, and explained that our secretary had faxed all of her d/c forms over to their facility prior to my calling and that she was going to have to refer to that paperwork. 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

## 2015-03-12 NOTE — NC FL2 (Addendum)
[place] MEDICAID FL2 LEVEL OF CARE SCREENING TOOL     IDENTIFICATION  Patient Name: Leslie Lamb Birthdate: [birth year] Sex: female Admission Date (Current Location): 03/12/2015  [place] and [place] Number: Administrator and Address:  [facility], [address], [place], [place] [address]      Provider Number: [id number]  Attending Physician Name and Address:  Darien Ramus, MD  Relative Name and Phone Number:       Current Level of Care: SNF Recommended Level of Care: Skilled Nursing Facility Prior Approval Number:    Date Approved/Denied:   PASRR Number:    Discharge Plan: SNF    Current Diagnoses: There are no active problems to display for this patient.   Orientation ACTIVITIES/SOCIAL BLADDER RESPIRATION       Family supportive Continent Normal  BEHAVIORAL SYMPTOMS/MOOD NEUROLOGICAL BOWEL NUTRITION STATUS      Continent Diet (Heart Healthy)  PHYSICIAN VISITS COMMUNICATION OF NEEDS Height & Weight Skin  30 days Verbally   145 lbs. Normal          AMBULATORY STATUS RESPIRATION    Assist extensive Normal      Personal Care Assistance Level of Assistance  Bathing Bathing Assistance: Maximum assistance   Dressing Assistance: Maximum assistance      Functional Limitations Info                SPECIAL CARE FACTORS FREQUENCY  PT (By licensed PT)     PT Frequency: 3x week              Additional Factors Info  Allergies   Allergies Info: Allergies (Allergies)           Current Medications (03/12/2015): No current facility-administered medications for this encounter.   Current Outpatient Prescriptions  Medication Sig Dispense Refill  . aspirin EC 81 MG tablet Take 81 mg by mouth daily.    Marland Kitchen atenolol (TENORMIN) 25 MG tablet Take 25 mg by mouth every morning.    . clobetasol ointment (TEMOVATE) 0.05 % Apply 1 application topically 2 (two) times daily as needed (for dryness on ears).    .  fluconazole (DIFLUCAN) 100 MG tablet Take 100 mg by mouth daily. For 7 days    . fluticasone (FLONASE) 50 MCG/ACT nasal spray Place 2 sprays into both nostrils daily as needed for allergies or rhinitis.    . folic acid (FOLVITE) 1 MG tablet Take 1 mg by mouth daily.    . furosemide (LASIX) 20 MG tablet Take 20 mg by mouth daily.    Marland Kitchen gabapentin (NEURONTIN) 100 MG capsule Take 100 mg by mouth 3 (three) times daily.    . Glycerin-Hypromellose-PEG 400 (HM DRY EYE RELIEF) 0.2-0.2-1 % SOLN Apply 1 drop to eye 2 (two) times daily as needed (for dry eyes).    Marland Kitchen losartan (COZAAR) 50 MG tablet Take 50 mg by mouth daily.    . meloxicam (MOBIC) 15 MG tablet Take 15 mg by mouth daily.    . methotrexate (RHEUMATREX) 2.5 MG tablet Take 15 mg by mouth once a week. On Tuesday    . metroNIDAZOLE (METROGEL) 1 % gel Apply 1 application topically daily as needed (for itching).    . mometasone (ELOCON) 0.1 % cream Apply 1 application topically 2 (two) times daily as needed (for dryness on ears).    . Multiple Vitamins-Minerals (CENTRUM SILVER PO) Take 1 tablet by mouth daily.    . Olopatadine HCl (PAZEO) 0.7 % SOLN  Place 1 drop into both eyes 2 (two) times daily.    Marland Kitchen oxyCODONE-acetaminophen (PERCOCET) 10-325 MG tablet Take 1 tablet by mouth every 8 (eight) hours as needed for pain.    . pantoprazole (PROTONIX) 40 MG tablet Take 40 mg by mouth daily.    . predniSONE (DELTASONE) 20 MG tablet Take 10 mg by mouth daily.    . simvastatin (ZOCOR) 20 MG tablet Take 20 mg by mouth at bedtime.     Do not use this list as official medication orders. Please verify with discharge summary.  Discharge Medications:   Medication List    ASK your doctor about these medications        aspirin EC 81 MG tablet  Take 81 mg by mouth daily.     atenolol 25 MG tablet  Commonly known as:  TENORMIN  Take 25 mg by mouth every morning.     CENTRUM SILVER PO  Take 1 tablet by mouth daily.     clobetasol ointment 0.05 %   Commonly known as:  TEMOVATE  Apply 1 application topically 2 (two) times daily as needed (for dryness on ears).     fluconazole 100 MG tablet  Commonly known as:  DIFLUCAN  Take 100 mg by mouth daily. For 7 days     fluticasone 50 MCG/ACT nasal spray  Commonly known as:  FLONASE  Place 2 sprays into both nostrils daily as needed for allergies or rhinitis.     folic acid 1 MG tablet  Commonly known as:  FOLVITE  Take 1 mg by mouth daily.     furosemide 20 MG tablet  Commonly known as:  LASIX  Take 20 mg by mouth daily.     gabapentin 100 MG capsule  Commonly known as:  NEURONTIN  Take 100 mg by mouth 3 (three) times daily.     HM DRY EYE RELIEF 0.2-0.2-1 % Soln  Generic drug:  Glycerin-Hypromellose-PEG 400  Apply 1 drop to eye 2 (two) times daily as needed (for dry eyes).     losartan 50 MG tablet  Commonly known as:  COZAAR  Take 50 mg by mouth daily.     meloxicam 15 MG tablet  Commonly known as:  MOBIC  Take 15 mg by mouth daily.     methotrexate 2.5 MG tablet  Commonly known as:  RHEUMATREX  Take 15 mg by mouth once a week. On Tuesday     metroNIDAZOLE 1 % gel  Commonly known as:  METROGEL  Apply 1 application topically daily as needed (for itching).     mometasone 0.1 % cream  Commonly known as:  ELOCON  Apply 1 application topically 2 (two) times daily as needed (for dryness on ears).     oxyCODONE-acetaminophen 10-325 MG tablet  Commonly known as:  PERCOCET  Take 1 tablet by mouth every 8 (eight) hours as needed for pain.     pantoprazole 40 MG tablet  Commonly known as:  PROTONIX  Take 40 mg by mouth daily.     PAZEO 0.7 % Soln  Generic drug:  Olopatadine HCl  Place 1 drop into both eyes 2 (two) times daily.     predniSONE 20 MG tablet  Commonly known as:  DELTASONE  Take 10 mg by mouth daily.     simvastatin 20 MG tablet  Commonly known as:  ZOCOR  Take 20 mg by mouth at bedtime.        Relevant Imaging Results:  Relevant Lab  Results:  Recent Labs    Additional Information    Ned Card, LCSW

## 2015-03-12 NOTE — Care Management (Signed)
Patient presents from home with back pain.  Patient lives at home with her son.  Patient also has additional family support.  Patient is normally transported by her son via car, however due to pain transportation has been limited.  MD discussed options with patient and family about patient discharging to [facility].  I informed patient and family that patient did not qualify with her insurance to be placed at rehab directly from the hospital.  Patient informed me that a home health referral had been made to [facility] for RP, RN, and SW.  Services have not yet been established.  Family would like to discuss with CSW options of private pay.  CSW placed. Delice Bison notified

## 2015-03-12 NOTE — Discharge Instructions (Signed)
Please seek medical attention for any high fevers, chest pain, shortness of breath, change in behavior, persistent vomiting, bloody stool or any other new or concerning symptoms. ° ° °Back Pain, Adult °Back pain is very common in adults. The cause of back pain is rarely dangerous and the pain often gets better over time. The cause of your back pain may not be known. Some common causes of back pain include: °· Strain of the muscles or ligaments supporting the spine. °· Wear and tear (degeneration) of the spinal disks. °· Arthritis. °· Direct injury to the back. °For many people, back pain may return. Since back pain is rarely dangerous, most people can learn to manage this condition on their own. °HOME CARE INSTRUCTIONS °Watch your back pain for any changes. The following actions may help to lessen any discomfort you are feeling: °· Remain active. It is stressful on your back to sit or stand in one place for long periods of time. Do not sit, drive, or stand in one place for more than 30 minutes at a time. Take short walks on even surfaces as soon as you are able. Try to increase the length of time you walk each day. °· Exercise regularly as directed by your health care provider. Exercise helps your back heal faster. It also helps avoid future injury by keeping your muscles strong and flexible. °· Do not stay in bed. Resting more than 1-2 days can delay your recovery. °· Pay attention to your body when you bend and lift. The most comfortable positions are those that put less stress on your recovering back. Always use proper lifting techniques, including: °¨ Bending your knees. °¨ Keeping the load close to your body. °¨ Avoiding twisting. °· Find a comfortable position to sleep. Use a firm mattress and lie on your side with your knees slightly bent. If you lie on your back, put a pillow under your knees. °· Avoid feeling anxious or stressed. Stress increases muscle tension and can worsen back pain. It is important to  recognize when you are anxious or stressed and learn ways to manage it, such as with exercise. °· Take medicines only as directed by your health care provider. Over-the-counter medicines to reduce pain and inflammation are often the most helpful. Your health care provider may prescribe muscle relaxant drugs. These medicines help dull your pain so you can more quickly return to your normal activities and healthy exercise. °· Apply ice to the injured area: °¨ Put ice in a plastic bag. °¨ Place a towel between your skin and the bag. °¨ Leave the ice on for 20 minutes, 2-3 times a day for the first 2-3 days. After that, ice and heat may be alternated to reduce pain and spasms. °· Maintain a healthy weight. Excess weight puts extra stress on your back and makes it difficult to maintain good posture. °SEEK MEDICAL CARE IF: °· You have pain that is not relieved with rest or medicine. °· You have increasing pain going down into the legs or buttocks. °· You have pain that does not improve in one week. °· You have night pain. °· You lose weight. °· You have a fever or chills. °SEEK IMMEDIATE MEDICAL CARE IF:  °· You develop new bowel or bladder control problems. °· You have unusual weakness or numbness in your arms or legs. °· You develop nausea or vomiting. °· You develop abdominal pain. °· You feel faint. °  °This information is not intended to replace advice given to you by your health care provider. Make   sure you discuss any questions you have with your health care provider. °  °Document Released: 04/28/2005 Document Revised: 05/19/2014 Document Reviewed: 08/30/2013 °Elsevier Interactive Patient Education ©2016 [employer] Inc. ° °

## 2015-03-12 NOTE — ED Provider Notes (Signed)
[facility] Emergency Department Provider Note  ____________________________________________  Time seen: 1034  I have reviewed the triage vital signs and the nursing notes.   HISTORY  Chief Complaint Back Pain     HPI Leslie Lamb is a 81 y.o. female who presents with pain in her back radiating down the right leg. This is following a fall from her wheelchair approximately 3 weeks ago. A week after having the fall she was seen in the emergency department with a negative x-ray of her lumbar spine and sacrum and coccyx area she has also been seen at [facility] clinic. The pain has been escalating and has peaked today, thus triggering her visit to the emergency department. She is here with her family who reports that her primary doctor, Dr. Dan Humphreys, told him to come to the emergency department if she was having uncontrolled pain. The patient has been taking oxycodone at home. Initially this was just once before bed at night. She is not taking it 3 or 4 times a day. It helped but only for about 3 hours.  The patient is alert and very pleasant and communicative. She is sitting in a position that leans to the left. She confirms pain in the right lower back into the right buttocks and down the right leg.   She does have a history of prior back problems with a lumbar fusion in 2009.  She denies any changes in urinary function. She has increased difficulty standing or ambulating but denies any weakness or loss of sensation in the distal legs.    Past Medical History  Diagnosis Date  . Hypertension   . Bursitis   . Hx of spinal fusion   . Hypercholesteremia     There are no active problems to display for this patient.   Past Surgical History  Procedure Laterality Date  . Spinal fusion    . Total vaginal hysterectomy    . Cholecystectomy    . Appendectomy    . Tonsillectomy      Current Outpatient Rx  Name  Route  Sig  Dispense  Refill  . aspirin EC 81  MG tablet   Oral   Take 81 mg by mouth daily.         Marland Kitchen atenolol (TENORMIN) 25 MG tablet   Oral   Take 25 mg by mouth every morning.         . clobetasol ointment (TEMOVATE) 0.05 %   Topical   Apply 1 application topically 2 (two) times daily as needed (for dryness on ears).         . fluconazole (DIFLUCAN) 100 MG tablet   Oral   Take 100 mg by mouth daily. For 7 days         . fluticasone (FLONASE) 50 MCG/ACT nasal spray   Each Nare   Place 2 sprays into both nostrils daily as needed for allergies or rhinitis.         . folic acid (FOLVITE) 1 MG tablet   Oral   Take 1 mg by mouth daily.         . furosemide (LASIX) 20 MG tablet   Oral   Take 20 mg by mouth daily.         Marland Kitchen gabapentin (NEURONTIN) 100 MG capsule   Oral   Take 100 mg by mouth 3 (three) times daily.         . Glycerin-Hypromellose-PEG 400 (HM DRY EYE RELIEF) 0.2-0.2-1 % SOLN  Ophthalmic   Apply 1 drop to eye 2 (two) times daily as needed (for dry eyes).         Marland Kitchen losartan (COZAAR) 50 MG tablet   Oral   Take 50 mg by mouth daily.         . meloxicam (MOBIC) 15 MG tablet   Oral   Take 15 mg by mouth daily.         . methotrexate (RHEUMATREX) 2.5 MG tablet   Oral   Take 15 mg by mouth once a week. On Tuesday         . metroNIDAZOLE (METROGEL) 1 % gel   Topical   Apply 1 application topically daily as needed (for itching).         . mometasone (ELOCON) 0.1 % cream   Topical   Apply 1 application topically 2 (two) times daily as needed (for dryness on ears).         . Multiple Vitamins-Minerals (CENTRUM SILVER PO)   Oral   Take 1 tablet by mouth daily.         . Olopatadine HCl (PAZEO) 0.7 % SOLN   Both Eyes   Place 1 drop into both eyes 2 (two) times daily.         Marland Kitchen oxyCODONE-acetaminophen (PERCOCET) 10-325 MG tablet   Oral   Take 1 tablet by mouth every 8 (eight) hours as needed for pain.         . pantoprazole (PROTONIX) 40 MG tablet   Oral   Take 40  mg by mouth daily.         . predniSONE (DELTASONE) 20 MG tablet   Oral   Take 10 mg by mouth daily.         . simvastatin (ZOCOR) 20 MG tablet   Oral   Take 20 mg by mouth at bedtime.           Allergies Ciprofloxacin and Sulfa antibiotics  No family history on file.  Social History Social History  Substance Use Topics  . Smoking status: Never Smoker   . Smokeless tobacco: None  . Alcohol Use: Yes    Review of Systems  Constitutional: Negative for fever. ENT: Negative for sore throat. Cardiovascular: Negative for chest pain. Respiratory: Negative for cough. Gastrointestinal: Negative for abdominal pain, vomiting and diarrhea. Genitourinary: Strong odor of urine noted by family and nursing staff. Musculoskeletal: Back pain. See history of present illness. Skin: Negative for rash. Neurological: Negative for paresthesia or weakness   10-point ROS otherwise negative.  ____________________________________________   PHYSICAL EXAM:  VITAL SIGNS: ED Triage Vitals  Enc Vitals Group     BP --      Pulse --      Resp --      Temp --      Temp src --      SpO2 --      Weight --      Height --      Head Cir --      Peak Flow --      Pain Score --      Pain Loc --      Pain Edu? --      Excl. in GC? --     Constitutional:  Alert and oriented. Twisting her body to the left, avoiding lying flat, but otherwise no acute distress. ENT   Head: Normocephalic and atraumatic. Cardiovascular: Normal rate, regular rhythm, no murmur noted Respiratory:  Normal respiratory  effort, no tachypnea.    Breath sounds are clear and equal bilaterally.  Gastrointestinal: Soft and nontender. No distention.  Back: No muscle spasm. Mild-to-moderate tenderness in the right paraspinal muscles into the right gluteus. Musculoskeletal: No deformity noted. Pelvis is stable, though she does report some mild tenderness with compression of the iliac wings. Good motion  distally. Neurologic:  Normal speech and language. No gross focal neurologic deficits are appreciated. Normal sensation in her distal legs.  Skin:  Skin is warm, dry. No rash noted. Psychiatric: Mood and affect are normal. Speech and behavior are normal.  ____________________________________________     RADIOLOGY  CT lumbar:   IMPRESSION: No acute finding in the lumbar region. Nondisplaced sacral fractures.  Previous fusion from L2 through L4 is solid. No evidence of hardware complication.  Chronic degenerative change on the left at L4-5 with foraminal narrowing on the left that could possibly affect the L4 nerve root.  CT pelvis:  IMPRESSION: 1. Bilateral nondisplaced sacral fractures, likely acute or subacute, with involvement of the left S1 neural foramen by the complex comminuted left sacral fracture. No pelvic diastasis. 2. Moderate fat containing umbilical hernia. 3. Mild sigmoid diverticulosis.   ____________________________________________   INITIAL IMPRESSION / ASSESSMENT AND PLAN / ED COURSE  Pertinent labs & imaging results that were available during my care of the patient were reviewed by me and considered in my medical decision making (see chart for details).  Unclear cause for the worsening pain in this 81 year old female with a notable history for prior back surgery and relatively recent fall with negative x-rays. We will perform a CT scan through the lumbar spine and pelvis. She has been taking oxycodone at home. We will give her 5 mg more now and continue to attempt to get her pain under better control.  ----------------------------------------- 1:12 PM on 03/12/2015 -----------------------------------------  CT scan shows nondisplaced fractures in the sacrum. This likely correlates with her pain. She is having difficulty with transfers and activities of daily living. I've discussed this with the patient and her family. They feel that it would be best if  she had a rehabilitation stay to help get her strength back and her level of function higher. I agree with this and will speak with case management.  ----------------------------------------- 2:20 PM on 03/12/2015 -----------------------------------------  Case management has seen the patient. We are requesting a social work consult as well. The family understands the coverage that they're Medicare policy provides but is still interested in pursuing a rehabilitation stay.  ----------------------------------------- 4:00 PM on 03/12/2015 -----------------------------------------  We continued to arrange for rehabilitation placement for the patient. I discussed case my colleague, Dr. Derrill Kay. He will further facilitate this as social work and case management find the appropriate resources for this patient. ____________________________________________   FINAL CLINICAL IMPRESSION(S) / ED DIAGNOSES  Final diagnoses:  Sacral fracture, closed, initial encounter  Ambulatory dysfunction      Darien Ramus, MD 03/12/15 1601

## 2015-03-12 NOTE — ED Notes (Signed)
Pt here with worsening back pain over the past few weeks. Pt suffers with chronic back pain with little relief on her pain medications. She sustained a fall approx. 3 weeks ago and her pain seems to have increased with the fall. Foul "ammonia" smell to pts urine upon helping her on bedpan. Pt alert and oriented. Daughter at bedside.

## 2015-03-13 ENCOUNTER — Encounter
Admission: RE | Admit: 2015-03-13 | Discharge: 2015-03-13 | Disposition: A | Discharge status: Home / Self Care | Payer: Medicare Other | Source: Ambulatory Visit | Attending: Internal Medicine | Admitting: Internal Medicine

## 2015-03-13 DIAGNOSIS — N390 Urinary tract infection, site not specified: Secondary | ICD-10-CM | POA: Insufficient documentation

## 2015-03-15 LAB — URINE CULTURE

## 2015-03-17 NOTE — Progress Notes (Signed)
81 y/o M in ED for back pain. Patient not d/c on abx. Urine culture growing E coli. Spoke to [facility], [employer] at [place]. RN was able to confirm that patient is on nitrofurantoin scheduled through 11/12 to which the organism is sensitive.

## 2015-04-11 DIAGNOSIS — N390 Urinary tract infection, site not specified: Secondary | ICD-10-CM | POA: Diagnosis not present

## 2015-04-11 LAB — URINALYSIS COMPLETE WITH MICROSCOPIC (ARMC ONLY)
BACTERIA UA: NONE SEEN
BILIRUBIN URINE: NEGATIVE
Glucose, UA: NEGATIVE mg/dL
HGB URINE DIPSTICK: NEGATIVE
Ketones, ur: NEGATIVE mg/dL
LEUKOCYTES UA: NEGATIVE
Nitrite: POSITIVE — AB
PH: 7 (ref 5.0–8.0)
PROTEIN: NEGATIVE mg/dL
RBC / HPF: NONE SEEN RBC/hpf (ref 0–5)
Specific Gravity, Urine: 1.008 (ref 1.005–1.030)

## 2015-04-12 ENCOUNTER — Encounter
Admission: RE | Admit: 2015-04-12 | Discharge: 2015-04-12 | Disposition: A | Discharge status: Home / Self Care | Payer: Medicare Other | Source: Ambulatory Visit | Attending: Internal Medicine | Admitting: Internal Medicine

## 2015-04-13 LAB — URINE CULTURE: Culture: >=100000

## 2015-05-06 ENCOUNTER — Encounter: Payer: Self-pay | Admitting: Emergency Medicine

## 2015-05-06 ENCOUNTER — Emergency Department
Admission: EM | Admit: 2015-05-06 | Discharge: 2015-05-06 | Disposition: A | Discharge status: Home / Self Care | Payer: Medicare Other | Attending: Emergency Medicine | Admitting: Emergency Medicine

## 2015-05-06 DIAGNOSIS — Y9389 Activity, other specified: Secondary | ICD-10-CM | POA: Insufficient documentation

## 2015-05-06 DIAGNOSIS — I10 Essential (primary) hypertension: Secondary | ICD-10-CM | POA: Diagnosis not present

## 2015-05-06 DIAGNOSIS — S8011XA Contusion of right lower leg, initial encounter: Secondary | ICD-10-CM | POA: Diagnosis not present

## 2015-05-06 DIAGNOSIS — Y998 Other external cause status: Secondary | ICD-10-CM | POA: Insufficient documentation

## 2015-05-06 DIAGNOSIS — Z7952 Long term (current) use of systemic steroids: Secondary | ICD-10-CM | POA: Diagnosis not present

## 2015-05-06 DIAGNOSIS — S81851A Open bite, right lower leg, initial encounter: Secondary | ICD-10-CM | POA: Diagnosis present

## 2015-05-06 DIAGNOSIS — Z79899 Other long term (current) drug therapy: Secondary | ICD-10-CM | POA: Insufficient documentation

## 2015-05-06 DIAGNOSIS — Z791 Long term (current) use of non-steroidal anti-inflammatories (NSAID): Secondary | ICD-10-CM | POA: Insufficient documentation

## 2015-05-06 DIAGNOSIS — W540XXA Bitten by dog, initial encounter: Secondary | ICD-10-CM | POA: Diagnosis not present

## 2015-05-06 DIAGNOSIS — Y9289 Other specified places as the place of occurrence of the external cause: Secondary | ICD-10-CM | POA: Insufficient documentation

## 2015-05-06 DIAGNOSIS — Z7982 Long term (current) use of aspirin: Secondary | ICD-10-CM | POA: Diagnosis not present

## 2015-05-06 MED ORDER — AMOXICILLIN-POT CLAVULANATE 875-125 MG PO TABS: 1.0000 | ORAL_TABLET | Freq: Two times a day (BID) | ORAL | Status: DC

## 2015-05-06 NOTE — ED Notes (Signed)
Pt states she was sitting at table at Daughter's house when her grandson's dog came under the table and bit her. States her son is from [place] and is visiting at daughter's house. Incident happened at [address] in [place]. Daughter's phone number is [phone]. Daughter's name is Stephens Shire and her son's name is Ulyses Jarred. Spoke with Amalia Hailey at [employer] re: animal bite. Will send officer to investigate.

## 2015-05-06 NOTE — ED Notes (Addendum)
Pt states her grandson's dog bit her on the R leg. Pt states she thinks the dog is up to date on shots but is not sure. [place] and presents with the wound covered at this time. Animal control has not been notified at this time.

## 2015-05-06 NOTE — ED Notes (Signed)
Pt verbalized understanding of discharge instructions and will give information to caregivers at [facility]. NAD at this time.

## 2015-05-06 NOTE — Discharge Instructions (Signed)

## 2015-05-06 NOTE — ED Provider Notes (Signed)
[facility] Emergency Department Provider Note  ____________________________________________  Time seen: Approximately 12:44 PM  I have reviewed the triage vital signs and the nursing notes.   HISTORY  Chief Complaint Animal Bite    HPI Leslie Lamb is a 81 y.o. female who presents to emergency department for dog bite to her right leg. She states that she was at home with her grandsons dog when the dog bit her on the lateral aspect of her right leg. The dog is up to date on all immunizations and has been acting normally. Patient states pain is minimal. She endorses a "laceration" from dog bite.   Past Medical History  Diagnosis Date  . Hypertension   . Bursitis   . Hx of spinal fusion   . Hypercholesteremia     There are no active problems to display for this patient.   Past Surgical History  Procedure Laterality Date  . Spinal fusion    . Total vaginal hysterectomy    . Cholecystectomy    . Appendectomy    . Tonsillectomy      Current Outpatient Rx  Name  Route  Sig  Dispense  Refill  . amoxicillin-clavulanate (AUGMENTIN) 875-125 MG tablet   Oral   Take 1 tablet by mouth 2 (two) times daily.   14 tablet   0   . aspirin EC 81 MG tablet   Oral   Take 81 mg by mouth daily.         Marland Kitchen atenolol (TENORMIN) 25 MG tablet   Oral   Take 25 mg by mouth every morning.         . clobetasol ointment (TEMOVATE) 0.05 %   Topical   Apply 1 application topically 2 (two) times daily as needed (for dryness on ears).         . fluconazole (DIFLUCAN) 100 MG tablet   Oral   Take 100 mg by mouth daily. For 7 days         . fluticasone (FLONASE) 50 MCG/ACT nasal spray   Each Nare   Place 2 sprays into both nostrils daily as needed for allergies or rhinitis.         . folic acid (FOLVITE) 1 MG tablet   Oral   Take 1 mg by mouth daily.         . furosemide (LASIX) 20 MG tablet   Oral   Take 20 mg by mouth daily.         Marland Kitchen  gabapentin (NEURONTIN) 100 MG capsule   Oral   Take 100 mg by mouth 3 (three) times daily.         . Glycerin-Hypromellose-PEG 400 (HM DRY EYE RELIEF) 0.2-0.2-1 % SOLN   Ophthalmic   Apply 1 drop to eye 2 (two) times daily as needed (for dry eyes).         Marland Kitchen losartan (COZAAR) 50 MG tablet   Oral   Take 50 mg by mouth daily.         . meloxicam (MOBIC) 15 MG tablet   Oral   Take 15 mg by mouth daily.         . methotrexate (RHEUMATREX) 2.5 MG tablet   Oral   Take 15 mg by mouth once a week. On Tuesday         . metroNIDAZOLE (METROGEL) 1 % gel   Topical   Apply 1 application topically daily as needed (for itching).         Marland Kitchen  mometasone (ELOCON) 0.1 % cream   Topical   Apply 1 application topically 2 (two) times daily as needed (for dryness on ears).         . Multiple Vitamins-Minerals (CENTRUM SILVER PO)   Oral   Take 1 tablet by mouth daily.         . Olopatadine HCl (PAZEO) 0.7 % SOLN   Both Eyes   Place 1 drop into both eyes 2 (two) times daily.         Marland Kitchen oxyCODONE (ROXICODONE) 5 MG immediate release tablet   Oral   Take 1 tablet (5 mg total) by mouth every 8 (eight) hours as needed.   15 tablet   0   . oxyCODONE-acetaminophen (PERCOCET) 10-325 MG tablet   Oral   Take 1 tablet by mouth every 8 (eight) hours as needed for pain.         . pantoprazole (PROTONIX) 40 MG tablet   Oral   Take 40 mg by mouth daily.         . predniSONE (DELTASONE) 20 MG tablet   Oral   Take 10 mg by mouth daily.         . simvastatin (ZOCOR) 20 MG tablet   Oral   Take 20 mg by mouth at bedtime.           Allergies Ciprofloxacin and Sulfa antibiotics  History reviewed. No pertinent family history.  Social History Social History  Substance Use Topics  . Smoking status: Never Smoker   . Smokeless tobacco: None  . Alcohol Use: Yes    Review of Systems Constitutional: No fever/chills Eyes: No visual changes. ENT: No sore  throat. Cardiovascular: Denies chest pain. Respiratory: Denies shortness of breath. Gastrointestinal: No abdominal pain.  No nausea, no vomiting.  No diarrhea.  No constipation. Genitourinary: Negative for dysuria. Musculoskeletal: Negative for back pain. Skin: Negative for rash. Dog bite to right lateral leg. Neurological: Negative for headaches, focal weakness or numbness.  10-point ROS otherwise negative.  ____________________________________________   PHYSICAL EXAM:  VITAL SIGNS: ED Triage Vitals  Enc Vitals Group     BP 05/06/15 1219 147/60 mmHg     Pulse Rate 05/06/15 1219 100     Resp 05/06/15 1219 18     Temp 05/06/15 1219 98.6 F (37 C)     Temp src --      SpO2 05/06/15 1219 96 %     Weight --      Height --      Head Cir --      Peak Flow --      Pain Score --      Pain Loc --      Pain Edu? --      Excl. in GC? --     Constitutional: Alert and oriented. Well appearing and in no acute distress. Eyes: Conjunctivae are normal. PERRL. EOMI. Head: Atraumatic. Nose: No congestion/rhinnorhea. Mouth/Throat: Mucous membranes are moist.  Oropharynx non-erythematous. Neck: No stridor.   Cardiovascular: Normal rate, regular rhythm. Grossly normal heart sounds.  Good peripheral circulation. Respiratory: Normal respiratory effort.  No retractions. Lungs CTAB. Gastrointestinal: Soft and nontender. No distention. No abdominal bruits. No CVA tenderness. Musculoskeletal: No lower extremity tenderness nor edema.  No joint effusions. Neurologic:  Normal speech and language. No gross focal neurologic deficits are appreciated. No gait instability. Skin:  Skin is warm, dry and intact. No rash noted. Ecchymosis and superficial skin tears noted to right lateral leg just  inferior to knee. 2 separate skin tears are noted. Superior and tear is approximately 1 cm in length. Inferior skin tears approximately 2-1/2 cm in length. Superficial in nature. No puncture wounds  visible. Psychiatric: Mood and affect are normal. Speech and behavior are normal.  ____________________________________________   LABS (all labs ordered are listed, but only abnormal results are displayed)  Labs Reviewed - No data to display ____________________________________________  EKG   ____________________________________________  RADIOLOGY   ____________________________________________   PROCEDURES  Procedure(s) performed: None  Critical Care performed: No  ____________________________________________   INITIAL IMPRESSION / ASSESSMENT AND PLAN / ED COURSE  Pertinent labs & imaging results that were available during my care of the patient were reviewed by me and considered in my medical decision making (see chart for details).  Patient presents emergency Department with a dog bite to right lateral leg. Superficial skin tears are noted with surrounding ecchymosis. These are not closed. Area is cleansed and covered in the emergency department. Patient will be placed on Augmentin for antibacterial coverage. Patient states the dog is up-to-date on all immunizations including rabies and declines vaccinations at this time. Patient is advised to keep area cleansed and covered can take full course of antibiotics. Patient verbalizes understanding and agreement with treatment plan.   New Prescriptions   AMOXICILLIN-CLAVULANATE (AUGMENTIN) 875-125 MG TABLET    Take 1 tablet by mouth 2 (two) times daily.    ____________________________________________   FINAL CLINICAL IMPRESSION(S) / ED DIAGNOSES  Final diagnoses:  Dog bite of calf, right, initial encounter      Racheal Patches, PA-C 05/06/15 1256  Jene Every, MD 05/06/15 1407

## 2017-05-14 ENCOUNTER — Other Ambulatory Visit: Payer: Self-pay | Admitting: Unknown Physician Specialty

## 2017-05-14 ENCOUNTER — Ambulatory Visit
Admission: RE | Admit: 2017-05-14 | Discharge: 2017-05-14 | Disposition: A | Discharge status: Home / Self Care | Payer: Medicare Other | Source: Ambulatory Visit | Attending: Unknown Physician Specialty | Admitting: Unknown Physician Specialty

## 2017-05-14 DIAGNOSIS — R918 Other nonspecific abnormal finding of lung field: Secondary | ICD-10-CM | POA: Insufficient documentation

## 2017-05-14 DIAGNOSIS — R059 Cough, unspecified: Secondary | ICD-10-CM

## 2017-05-14 DIAGNOSIS — R05 Cough: Secondary | ICD-10-CM | POA: Diagnosis present

## 2017-05-25 ENCOUNTER — Encounter: Payer: Self-pay | Admitting: Internal Medicine

## 2017-05-25 ENCOUNTER — Ambulatory Visit (INDEPENDENT_AMBULATORY_CARE_PROVIDER_SITE_OTHER): Payer: Medicare Other | Admitting: Internal Medicine

## 2017-05-25 VITALS — BP 108/62 | HR 54 | Ht 59.0 in | Wt 137.0 lb

## 2017-05-25 DIAGNOSIS — J449 Chronic obstructive pulmonary disease, unspecified: Secondary | ICD-10-CM | POA: Diagnosis not present

## 2017-05-25 DIAGNOSIS — R05 Cough: Secondary | ICD-10-CM

## 2017-05-25 DIAGNOSIS — R059 Cough, unspecified: Secondary | ICD-10-CM

## 2017-05-25 MED ORDER — ALBUTEROL SULFATE HFA 108 (90 BASE) MCG/ACT IN AERS: 2.0000 | INHALATION_SPRAY | Freq: Four times a day (QID) | RESPIRATORY_TRACT | 2 refills | Status: AC | PRN

## 2017-05-25 NOTE — Patient Instructions (Signed)
Albuterol as needed 

## 2017-05-25 NOTE — Progress Notes (Signed)
Name: Leslie Lamb MRN: [id number] DOB: 1923-06-26     CONSULTATION DATE: 05/25/17 REFERRING MD : Jenne Campus  CHIEF COMPLAINT:  cough  STUDIES:  CXR 05/14/17  I have Independently reviewed images of  CXR   on 05/25/2017 Interpretation:No acute opacities Flattened diaphragms and increased lung volumes    HISTORY OF PRESENT ILLNESS:  84 yo seen today for chronic cough Started 2.5 months ago Started with very productive cough She states that it has improved with time Denies fevers, chills, NVD No SOB and no increased WOB Patient has had second hand smoke exposure Does NOT smoke  Patient plays piano Has Dx of RA sees Dr Gavin [facility] (chronic MTX and prednisone)  Patient was seen by ENT for b/ l hearing loss    PAST MEDICAL HISTORY :   has a past medical history of Bursitis, spinal fusion, Hypercholesteremia, and Hypertension.  has a past surgical history that includes Spinal fusion; Total vaginal hysterectomy; Cholecystectomy; Appendectomy; and Tonsillectomy. Prior to Admission medications   Medication Sig Start Date End Date Taking? Authorizing Provider  amoxicillin-clavulanate (AUGMENTIN) 875-125 MG tablet Take 1 tablet by mouth 2 (two) times daily. 05/06/15   Cuthriell, Delorise Royals, PA-C  aspirin EC 81 MG tablet Take 81 mg by mouth daily.    [provider]  atenolol (TENORMIN) 25 MG tablet Take 25 mg by mouth every morning.    [provider]  clobetasol ointment (TEMOVATE) 0.05 % Apply 1 application topically 2 (two) times daily as needed (for dryness on ears).    [provider]  fluconazole (DIFLUCAN) 100 MG tablet Take 100 mg by mouth daily. For 7 days 03/09/15   [provider]  fluticasone (FLONASE) 50 MCG/ACT nasal spray Place 2 sprays into both nostrils daily as needed for allergies or rhinitis.    [provider]  folic acid (FOLVITE) 1 MG tablet Take 1 mg by mouth daily.    [provider]    furosemide (LASIX) 20 MG tablet Take 20 mg by mouth daily.    [provider]  gabapentin (NEURONTIN) 100 MG capsule Take 100 mg by mouth 3 (three) times daily.    [provider]  Glycerin-Hypromellose-PEG 400 (HM DRY EYE RELIEF) 0.2-0.2-1 % SOLN Apply 1 drop to eye 2 (two) times daily as needed (for dry eyes).    [provider]  losartan (COZAAR) 50 MG tablet Take 50 mg by mouth daily.    [provider]  meloxicam (MOBIC) 15 MG tablet Take 15 mg by mouth daily.    [provider]  methotrexate (RHEUMATREX) 2.5 MG tablet Take 15 mg by mouth once a week. On Tuesday    [provider]  metroNIDAZOLE (METROGEL) 1 % gel Apply 1 application topically daily as needed (for itching).    [provider]  mometasone (ELOCON) 0.1 % cream Apply 1 application topically 2 (two) times daily as needed (for dryness on ears).    [provider]  Multiple Vitamins-Minerals (CENTRUM SILVER PO) Take 1 tablet by mouth daily.    [provider]  Olopatadine HCl (PAZEO) 0.7 % SOLN Place 1 drop into both eyes 2 (two) times daily.    [provider]  oxyCODONE-acetaminophen (PERCOCET) 10-325 MG tablet Take 1 tablet by mouth every 8 (eight) hours as needed for pain.    [provider]  pantoprazole (PROTONIX) 40 MG tablet Take 40 mg by mouth daily.    [provider]  predniSONE (DELTASONE) 20 MG  tablet Take 10 mg by mouth daily.    [provider]  simvastatin (ZOCOR) 20 MG tablet Take 20 mg by mouth at bedtime.    [provider]   Allergies  Allergen Reactions  . Ciprofloxacin Nausea Only  . Sulfa Antibiotics Hives    FAMILY HISTORY:  +HTN  SOCIAL HISTORY:  reports that  has never smoked. She does not have any smokeless tobacco history on file. She reports that she drinks alcohol. She reports that she does not use drugs.  REVIEW OF SYSTEMS:   Constitutional: Negative for fever,  chills, weight loss, malaise/fatigue and diaphoresis.  HENT: Negative for hearing loss, ear pain, nosebleeds, congestion, sore throat, neck pain, tinnitus and ear discharge.   Eyes: Negative for blurred vision, double vision, photophobia, pain, discharge and redness.  Respiratory: Negative for cough, hemoptysis, sputum production, shortness of breath, wheezing and stridor.   Cardiovascular: Negative for chest pain, palpitations, orthopnea, claudication, leg swelling and PND.  Gastrointestinal: Negative for heartburn, nausea, vomiting, abdominal pain, diarrhea, constipation, blood in stool and melena.  Genitourinary: Negative for dysuria, urgency, frequency, hematuria and flank pain.  Musculoskeletal: Negative for myalgias, back pain, joint pain and falls.  Skin: Negative for itching and rash.  Neurological: Negative for dizziness, tingling, tremors, sensory change, speech change, focal weakness, seizures, loss of consciousness, weakness and headaches.  Endo/Heme/Allergies: Negative for environmental allergies and polydipsia. Does not bruise/bleed easily.  ALL OTHER ROS ARE NEGATIVE    VITAL SIGNS: BP 108/62 (BP Location: Left Arm, Cuff Size: Normal)   Pulse (!) 54   Ht 4\' 11"  (1.499 m)   Wt 137 lb (62.1 kg)   SpO2 99%   BMI 27.67 kg/m   Physical Examination:   GENERAL:NAD, no fevers, chills, no weakness no fatigue HEAD: Normocephalic, atraumatic.  EYES: Pupils equal, round, reactive to light. Extraocular muscles intact. No scleral icterus.  MOUTH: Moist mucosal membrane. Dentition intact. No abscess noted.  EAR, NOSE, THROAT: Clear without exudates. No external lesions.  NECK: Supple. No thyromegaly. No nodules. No JVD.  PULMONARY: Diffuse coarse rhonchi right sided +wheezes CARDIOVASCULAR: S1 and S2. Regular rate and rhythm. No murmurs, rubs, or gallops. No edema. Pedal pulses 2+ bilaterally.  GASTROINTESTINAL: Soft, nontender, nondistended. No masses. Positive bowel sounds. No  hepatosplenomegaly.  MUSCULOSKELETAL: No swelling, clubbing, or edema. Range of motion full in all extremities.  NEUROLOGIC: Cranial nerves II through XII are intact. No gross focal neurological deficits. Sensation intact. Reflexes intact.  SKIN: No ulceration, lesions, rashes, or cyanosis. Skin warm and dry. Turgor intact.  PSYCHIATRIC: Mood, affect within normal limits. The patient is awake, alert and oriented x 3. Insight, judgment intact.  ALL OTHER ROS ARE NEGATIVE   ASSESSMENT / PLAN: 84 yo white female with chronic cough most likely due to viral bronchitis with post viral cough with improving symptoms with probable underlying COPD.  AT this time, her symptoms have improved and she feels much better  I have advised that I will prescribe albuterol as needed No indication for steroids or ABX at this time   Patient/Family are satisfied with Plan of action and management. All questions answered Follow up as needed   84, M.D.  [facility]  Medical Director [facility] Medical Director [facility] Cardio-Pulmonary Department    ;

## 2017-06-09 ENCOUNTER — Ambulatory Visit (INDEPENDENT_AMBULATORY_CARE_PROVIDER_SITE_OTHER): Payer: Medicare Other | Admitting: Urology

## 2017-06-09 ENCOUNTER — Encounter: Payer: Self-pay | Admitting: Urology

## 2017-06-09 VITALS — BP 98/61 | HR 61 | Ht 59.0 in | Wt 137.3 lb

## 2017-06-09 DIAGNOSIS — M719 Bursopathy, unspecified: Secondary | ICD-10-CM | POA: Insufficient documentation

## 2017-06-09 DIAGNOSIS — N3941 Urge incontinence: Secondary | ICD-10-CM

## 2017-06-09 DIAGNOSIS — N390 Urinary tract infection, site not specified: Secondary | ICD-10-CM | POA: Diagnosis not present

## 2017-06-09 DIAGNOSIS — M7550 Bursitis of unspecified shoulder: Secondary | ICD-10-CM

## 2017-06-09 MED ORDER — DOXYCYCLINE HYCLATE 100 MG PO CAPS: 100.0000 mg | ORAL_CAPSULE | Freq: Two times a day (BID) | ORAL | 0 refills | Status: AC

## 2017-06-09 NOTE — Progress Notes (Signed)
06/09/2017 2:35 PM   Leslie Lamb 1923-06-02 [id number]  Referring provider: Gracelyn Nurse, MD [address] [place], [place] [address]  Chief Complaint  Patient presents with  . Urinary Tract Infection    HPI: 84 year old female I have previously seen for chronic urge incontinence and recurrent UTIs.  I last saw her in 2015.  She failed anticholinergic and beta 3 agonist medications for her urge incontinence.  She did undergo PT NS with mild improvement.  She declined Botox.  She states her incontinence is stable and not bothersome.  She presents today with a several day history of urinary frequency and dysuria.  She denies fever, chills or gross hematuria.   PMH: Past Medical History:  Diagnosis Date  . Bursitis   . Hx of spinal fusion   . Hypercholesteremia   . Hypertension     Surgical History: Past Surgical History:  Procedure Laterality Date  . APPENDECTOMY    . CHOLECYSTECTOMY    . SPINAL FUSION    . TONSILLECTOMY    . TOTAL VAGINAL HYSTERECTOMY      Home Medications:  Allergies as of 06/09/2017      Reactions   Ciprofloxacin Nausea Only   Macrodantin  [nitrofurantoin Macrocrystal]    Penicillin V Potassium Other (See Comments)   Sulfa Antibiotics Hives   Nitrofurantoin Nausea Only, Other (See Comments), Nausea And Vomiting      Medication List        Accurate as of 06/09/17  2:35 PM. Always use your most recent med list.          albuterol 108 (90 Base) MCG/ACT inhaler Commonly known as:  PROVENTIL HFA;VENTOLIN HFA Inhale 2 puffs into the lungs every 6 (six) hours as needed for wheezing or shortness of breath.   aspirin EC 81 MG tablet Take 81 mg by mouth daily.   atenolol 25 MG tablet Commonly known as:  TENORMIN Take 25 mg by mouth every morning.   CENTRUM SILVER PO Take 1 tablet by mouth daily.   clobetasol ointment 0.05 % Commonly known as:  TEMOVATE Apply 1 application topically 2 (two) times daily as needed (for  dryness on ears).   fluconazole 100 MG tablet Commonly known as:  DIFLUCAN Take 100 mg by mouth daily. For 7 days   fluticasone 50 MCG/ACT nasal spray Commonly known as:  FLONASE Place 2 sprays into both nostrils daily as needed for allergies or rhinitis.   folic acid 1 MG tablet Commonly known as:  FOLVITE Take 1 mg by mouth daily.   furosemide 20 MG tablet Commonly known as:  LASIX Take 20 mg by mouth daily.   gabapentin 100 MG capsule Commonly known as:  NEURONTIN Take 100 mg by mouth 3 (three) times daily.   HM DRY EYE RELIEF 0.2-0.2-1 % Soln Generic drug:  Glycerin-Hypromellose-PEG 400 Apply 1 drop to eye 2 (two) times daily as needed (for dry eyes).   losartan 50 MG tablet Commonly known as:  COZAAR Take 50 mg by mouth daily.   methotrexate 2.5 MG tablet Commonly known as:  RHEUMATREX Take 15 mg by mouth once a week. On Tuesday   metroNIDAZOLE 1 % gel Commonly known as:  METROGEL Apply 1 application topically daily as needed (for itching).   mometasone 0.1 % cream Commonly known as:  ELOCON Apply 1 application topically 2 (two) times daily as needed (for dryness on ears).   oxyCODONE-acetaminophen 10-325 MG tablet Commonly known as:  PERCOCET Take 1 tablet  by mouth every 8 (eight) hours as needed for pain.   pantoprazole 40 MG tablet Commonly known as:  PROTONIX Take 40 mg by mouth daily.   PAZEO 0.7 % Soln Generic drug:  Olopatadine HCl Place 1 drop into both eyes 2 (two) times daily.   predniSONE 5 MG tablet Commonly known as:  DELTASONE Take 2.5 mg by mouth daily.   simvastatin 20 MG tablet Commonly known as:  ZOCOR Take 20 mg by mouth at bedtime.       Allergies:  Allergies  Allergen Reactions  . Ciprofloxacin Nausea Only  . Macrodantin  [Nitrofurantoin Macrocrystal]   . Penicillin V Potassium Other (See Comments)  . Sulfa Antibiotics Hives  . Nitrofurantoin Nausea Only, Other (See Comments) and Nausea And Vomiting    Family  History: History reviewed. No pertinent family history.  Social History:  reports that  has never smoked. she has never used smokeless tobacco. She reports that she drinks alcohol. She reports that she does not use drugs.  ROS: UROLOGY Frequent Urination?: Yes Hard to postpone urination?: Yes Burning/pain with urination?: Yes Get up at night to urinate?: Yes Leakage of urine?: Yes Urine stream starts and stops?: No Trouble starting stream?: Yes Do you have to strain to urinate?: Yes Blood in urine?: No Urinary tract infection?: Yes Sexually transmitted disease?: No Injury to kidneys or bladder?: No Painful intercourse?: No Weak stream?: No Currently pregnant?: No Vaginal bleeding?: No Last menstrual period?: n  Gastrointestinal Nausea?: No Vomiting?: No Indigestion/heartburn?: No Diarrhea?: No Constipation?: Yes  Constitutional Fever: No Night sweats?: No Weight loss?: No Fatigue?: Yes  Skin Skin rash/lesions?: Yes Itching?: No  Eyes Blurred vision?: Yes Double vision?: No  Ears/Nose/Throat Sore throat?: Yes Sinus problems?: No  Hematologic/Lymphatic Swollen glands?: No Easy bruising?: Yes  Cardiovascular Leg swelling?: No Chest pain?: No  Respiratory Cough?: Yes Shortness of breath?: No  Endocrine Excessive thirst?: No  Musculoskeletal Back pain?: Yes Joint pain?: No  Neurological Headaches?: No Dizziness?: No  Psychologic Depression?: No Anxiety?: No  Physical Exam: BP 98/61 (BP Location: Left Arm, Patient Position: Sitting, Cuff Size: Large)   Pulse 61   Ht 4\' 11"  (1.499 m)   Wt 137 lb 4.8 oz (62.3 kg)   BMI 27.73 kg/m   Constitutional:  Alert and oriented, No acute distress. HEENT: [place] AT, moist mucus membranes.  Trachea midline, no masses. Cardiovascular: No clubbing, cyanosis, or edema. Respiratory: Normal respiratory effort, no increased work of breathing. GI: Abdomen is soft, nontender, nondistended, no abdominal  masses GU: No CVA tenderness. Skin: No rashes, bruises or suspicious lesions. Lymph: No cervical or inguinal adenopathy. Neurologic: Grossly intact, no focal deficits, moving all 4 extremities. Psychiatric: Normal mood and affect.  Laboratory Data: Lab Results  Component Value Date   WBC 6.2 02/25/2008   HGB 13.0 02/25/2008   HCT 38.9 02/25/2008   MCV 92.8 02/25/2008   PLT 221 02/25/2008    Lab Results  Component Value Date   CREATININE 0.98 02/25/2008    Urinalysis Dipstick 1+ protein, trace intact blood, nitrite positive, 3+ leukocytes Microscopy 6-10 WBC, many bacteria  Assessment & Plan:   83 year old female with a symptomatic UTI.  Urine culture was ordered.  Will start empiric antibiotic therapy pending the culture result.  1.  Recurrent UTI  2. Urge incontinence  - Urinalysis, Complete  I was informed after she left the clinic that there was not enough urine to send for culture.  She will be contacted and if her  symptoms do not improve on this antibiotic another sample will be obtained.    Riki Altes, MD  [facility] [address] [place], [place] [address] [phone]

## 2017-06-10 LAB — URINALYSIS, COMPLETE
BILIRUBIN UA: NEGATIVE
Glucose, UA: NEGATIVE
Ketones, UA: NEGATIVE
Nitrite, UA: POSITIVE — AB
PH UA: 7 (ref 5.0–7.5)
Specific Gravity, UA: 1.01 (ref 1.005–1.030)
UUROB: 0.2 mg/dL (ref 0.2–1.0)

## 2017-06-10 LAB — MICROSCOPIC EXAMINATION

## 2017-07-05 ENCOUNTER — Emergency Department: Payer: Medicare Other

## 2017-07-05 ENCOUNTER — Inpatient Hospital Stay
Admission: EM | Admit: 2017-07-05 | Discharge: 2017-07-07 | DRG: 690 | Disposition: A | Discharge status: Skilled Nursing Facility | Payer: Medicare Other | Attending: Internal Medicine | Admitting: Internal Medicine

## 2017-07-05 ENCOUNTER — Other Ambulatory Visit: Payer: Self-pay

## 2017-07-05 DIAGNOSIS — E7800 Pure hypercholesterolemia, unspecified: Secondary | ICD-10-CM | POA: Diagnosis present

## 2017-07-05 DIAGNOSIS — R32 Unspecified urinary incontinence: Secondary | ICD-10-CM | POA: Diagnosis present

## 2017-07-05 DIAGNOSIS — M069 Rheumatoid arthritis, unspecified: Secondary | ICD-10-CM | POA: Diagnosis present

## 2017-07-05 DIAGNOSIS — Z90710 Acquired absence of both cervix and uterus: Secondary | ICD-10-CM | POA: Diagnosis not present

## 2017-07-05 DIAGNOSIS — I10 Essential (primary) hypertension: Secondary | ICD-10-CM | POA: Diagnosis present

## 2017-07-05 DIAGNOSIS — E785 Hyperlipidemia, unspecified: Secondary | ICD-10-CM | POA: Diagnosis present

## 2017-07-05 DIAGNOSIS — F0390 Unspecified dementia without behavioral disturbance: Secondary | ICD-10-CM | POA: Diagnosis present

## 2017-07-05 DIAGNOSIS — Z993 Dependence on wheelchair: Secondary | ICD-10-CM | POA: Diagnosis not present

## 2017-07-05 DIAGNOSIS — Z7982 Long term (current) use of aspirin: Secondary | ICD-10-CM

## 2017-07-05 DIAGNOSIS — Z66 Do not resuscitate: Secondary | ICD-10-CM | POA: Diagnosis present

## 2017-07-05 DIAGNOSIS — K219 Gastro-esophageal reflux disease without esophagitis: Secondary | ICD-10-CM | POA: Diagnosis present

## 2017-07-05 DIAGNOSIS — Z981 Arthrodesis status: Secondary | ICD-10-CM | POA: Diagnosis not present

## 2017-07-05 DIAGNOSIS — N390 Urinary tract infection, site not specified: Principal | ICD-10-CM | POA: Diagnosis present

## 2017-07-05 DIAGNOSIS — R531 Weakness: Secondary | ICD-10-CM

## 2017-07-05 LAB — COMPREHENSIVE METABOLIC PANEL
ALK PHOS: 58 U/L (ref 38–126)
ALT: 21 U/L (ref 14–54)
AST: 44 U/L — AB (ref 15–41)
Albumin: 3.1 g/dL — ABNORMAL LOW (ref 3.5–5.0)
Anion gap: 8 (ref 5–15)
BILIRUBIN TOTAL: 0.9 mg/dL (ref 0.3–1.2)
BUN: 21 mg/dL — AB (ref 6–20)
CO2: 27 mmol/L (ref 22–32)
Calcium: 8.6 mg/dL — ABNORMAL LOW (ref 8.9–10.3)
Chloride: 101 mmol/L (ref 101–111)
Creatinine, Ser: 0.92 mg/dL (ref 0.44–1.00)
GFR calc Af Amer: >60 mL/min (ref >60)
GFR, EST NON AFRICAN AMERICAN: 53 mL/min — AB (ref >60)
Glucose, Bld: 116 mg/dL — ABNORMAL HIGH (ref 65–99)
POTASSIUM: 3.7 mmol/L (ref 3.5–5.1)
Sodium: 136 mmol/L (ref 135–145)
Total Protein: 6.5 g/dL (ref 6.5–8.1)

## 2017-07-05 LAB — INFLUENZA PANEL BY PCR (TYPE A & B)
INFLAPCR: NEGATIVE
Influenza B By PCR: NEGATIVE

## 2017-07-05 LAB — CBC
HEMATOCRIT: 33.6 % — AB (ref 35.0–47.0)
HEMOGLOBIN: 11.3 g/dL — AB (ref 12.0–16.0)
MCH: 33.2 pg (ref 26.0–34.0)
MCHC: 33.7 g/dL (ref 32.0–36.0)
MCV: 98.5 fL (ref 80.0–100.0)
Platelets: 154 10*3/uL (ref 150–440)
RBC: 3.41 MIL/uL — ABNORMAL LOW (ref 3.80–5.20)
RDW: 15.7 % — ABNORMAL HIGH (ref 11.5–14.5)
WBC: 8.9 10*3/uL (ref 3.6–11.0)

## 2017-07-05 LAB — URINALYSIS, COMPLETE (UACMP) WITH MICROSCOPIC
Bilirubin Urine: NEGATIVE
GLUCOSE, UA: NEGATIVE mg/dL
KETONES UR: 5 mg/dL — AB
NITRITE: POSITIVE — AB
PROTEIN: 100 mg/dL — AB
Specific Gravity, Urine: 1.016 (ref 1.005–1.030)
Squamous Epithelial / LPF: NONE SEEN
pH: 5 (ref 5.0–8.0)

## 2017-07-05 LAB — TROPONIN I: Troponin I: 0.03 ng/mL (ref <0.03)

## 2017-07-05 MED ORDER — DONEPEZIL HCL 5 MG PO TABS
5.0000 mg | ORAL_TABLET | Freq: Every day | ORAL | Status: DC
  Administered 2017-07-05 – 2017-07-07 (×3): 5 mg via ORAL
  Filled 2017-07-05 (×3): qty 1

## 2017-07-05 MED ORDER — POLYVINYL ALCOHOL 1.4 % OP SOLN
1.0000 [drp] | OPHTHALMIC | Status: DC | PRN
Start: 2017-07-05 — End: 2017-07-07
  Filled 2017-07-05: qty 15

## 2017-07-05 MED ORDER — SODIUM CHLORIDE 0.9 % IV BOLUS (SEPSIS)
1000.0000 mL | Freq: Once | INTRAVENOUS | Status: AC
  Administered 2017-07-05: 1000 mL via INTRAVENOUS

## 2017-07-05 MED ORDER — ENOXAPARIN SODIUM 40 MG/0.4ML ~~LOC~~ SOLN
40.0000 mg | SUBCUTANEOUS | Status: DC
  Administered 2017-07-05 – 2017-07-06 (×2): 40 mg via SUBCUTANEOUS
  Filled 2017-07-05 (×2): qty 0.4

## 2017-07-05 MED ORDER — SIMVASTATIN 20 MG PO TABS
20.0000 mg | ORAL_TABLET | Freq: Every day | ORAL | Status: DC
  Administered 2017-07-05 – 2017-07-06 (×2): 20 mg via ORAL
  Filled 2017-07-05 (×2): qty 1

## 2017-07-05 MED ORDER — ONDANSETRON HCL 4 MG/2ML IJ SOLN: 4.0000 mg | Freq: Four times a day (QID) | INTRAMUSCULAR | Status: DC | PRN

## 2017-07-05 MED ORDER — SODIUM CHLORIDE 0.9 % IV SOLN
1.0000 g | INTRAVENOUS | Status: DC
  Administered 2017-07-06: 1 g via INTRAVENOUS
  Filled 2017-07-05 (×2): qty 10

## 2017-07-05 MED ORDER — OLOPATADINE HCL 0.7 % OP SOLN: 1.0000 [drp] | Freq: Two times a day (BID) | OPHTHALMIC | Status: DC

## 2017-07-05 MED ORDER — ATENOLOL 25 MG PO TABS
25.0000 mg | ORAL_TABLET | ORAL | Status: DC
  Filled 2017-07-05: qty 1

## 2017-07-05 MED ORDER — ALBUTEROL SULFATE (2.5 MG/3ML) 0.083% IN NEBU: 3.0000 mL | INHALATION_SOLUTION | Freq: Four times a day (QID) | RESPIRATORY_TRACT | Status: DC | PRN

## 2017-07-05 MED ORDER — ONDANSETRON HCL 4 MG PO TABS: 4.0000 mg | ORAL_TABLET | Freq: Four times a day (QID) | ORAL | Status: DC | PRN

## 2017-07-05 MED ORDER — SODIUM CHLORIDE 0.9 % IV SOLN
1.0000 g | Freq: Once | INTRAVENOUS | Status: AC
  Administered 2017-07-05: 1 g via INTRAVENOUS
  Filled 2017-07-05: qty 10

## 2017-07-05 MED ORDER — CLOBETASOL PROPIONATE 0.05 % EX OINT
1.0000 "application " | TOPICAL_OINTMENT | Freq: Two times a day (BID) | CUTANEOUS | Status: DC | PRN
  Filled 2017-07-05: qty 15

## 2017-07-05 MED ORDER — FLUTICASONE PROPIONATE 50 MCG/ACT NA SUSP
2.0000 | Freq: Every day | NASAL | Status: DC | PRN
  Filled 2017-07-05: qty 16

## 2017-07-05 MED ORDER — ACETAMINOPHEN 650 MG RE SUPP
650.0000 mg | Freq: Four times a day (QID) | RECTAL | Status: DC | PRN
Start: 2017-07-05 — End: 2017-07-07

## 2017-07-05 MED ORDER — LOSARTAN POTASSIUM 50 MG PO TABS
50.0000 mg | ORAL_TABLET | Freq: Every day | ORAL | Status: DC
  Administered 2017-07-05: 50 mg via ORAL
  Filled 2017-07-05: qty 1

## 2017-07-05 MED ORDER — PREDNISONE 2.5 MG PO TABS
2.5000 mg | ORAL_TABLET | Freq: Every day | ORAL | Status: DC
  Administered 2017-07-05 – 2017-07-07 (×3): 2.5 mg via ORAL
  Filled 2017-07-05 (×3): qty 1

## 2017-07-05 MED ORDER — PANTOPRAZOLE SODIUM 40 MG PO TBEC
40.0000 mg | DELAYED_RELEASE_TABLET | Freq: Every day | ORAL | Status: DC
  Administered 2017-07-05 – 2017-07-07 (×3): 40 mg via ORAL
  Filled 2017-07-05 (×2): qty 1

## 2017-07-05 MED ORDER — ASPIRIN EC 81 MG PO TBEC
81.0000 mg | DELAYED_RELEASE_TABLET | Freq: Every day | ORAL | Status: DC
  Administered 2017-07-05 – 2017-07-07 (×3): 81 mg via ORAL
  Filled 2017-07-05 (×3): qty 1

## 2017-07-05 MED ORDER — SODIUM CHLORIDE 0.9 % IV SOLN
INTRAVENOUS | Status: DC
  Administered 2017-07-05: 22:00:00 via INTRAVENOUS

## 2017-07-05 MED ORDER — ACETAMINOPHEN 325 MG PO TABS
650.0000 mg | ORAL_TABLET | Freq: Four times a day (QID) | ORAL | Status: DC | PRN
Start: 2017-07-05 — End: 2017-07-07
  Administered 2017-07-07: 11:00:00 650 mg via ORAL
  Filled 2017-07-05 (×2): qty 2

## 2017-07-05 MED ORDER — FOLIC ACID 1 MG PO TABS
1.0000 mg | ORAL_TABLET | Freq: Every day | ORAL | Status: DC
  Administered 2017-07-05 – 2017-07-07 (×3): 1 mg via ORAL
  Filled 2017-07-05 (×3): qty 1

## 2017-07-05 MED ORDER — OLOPATADINE HCL 0.1 % OP SOLN
1.0000 [drp] | Freq: Two times a day (BID) | OPHTHALMIC | Status: DC
  Administered 2017-07-05 – 2017-07-07 (×4): 1 [drp] via OPHTHALMIC
  Filled 2017-07-05: qty 5

## 2017-07-05 NOTE — ED Provider Notes (Signed)
[facility] Emergency Department Provider Note  Time seen: 4:38 PM  I have reviewed the triage vital signs and the nursing notes.   HISTORY  Chief Complaint Weakness    HPI Leslie Lamb is a 84 y.o. female with a past medical history of hypertension, hyperlipidemia, presents to the emergency department for cough and generalized weakness.  According to the patient and report the patient lives at home, she states for the past 6 or 7 days she has had significant cough now with yellow sputum production.  Progressively worse generalized weakness.  States 3 days ago she had a fall out of her wheelchair is falling onto the ground injuring her right shoulder.  Was seen by her doctor and diagnosed with a possible humerus fracture.  States over the past 3 days she has gotten progressively more weak, today cannot get out of bed.  Is coughing with yellow sputum production states pain in her right chest.  Denies any known fever.  States moderate congestion.  Denies body aches.  Denies shortness of breath abdominal pain nausea vomiting diarrhea or dysuria.  States incontinence at baseline.  Past Medical History:  Diagnosis Date  . Bursitis   . Hx of spinal fusion   . Hypercholesteremia   . Hypertension     Patient Active Problem List   Diagnosis Date Noted  . Disorder of bursae of shoulder region 06/09/2017  . Urge incontinence 03/31/2014    Past Surgical History:  Procedure Laterality Date  . APPENDECTOMY    . CHOLECYSTECTOMY    . SPINAL FUSION    . TONSILLECTOMY    . TOTAL VAGINAL HYSTERECTOMY      Prior to Admission medications   Medication Sig Start Date End Date Taking? Authorizing Provider  albuterol (PROVENTIL HFA;VENTOLIN HFA) 108 (90 Base) MCG/ACT inhaler Inhale 2 puffs into the lungs every 6 (six) hours as needed for wheezing or shortness of breath. 05/25/17   Erin Fulling, MD  aspirin EC 81 MG tablet Take 81 mg by mouth daily.    [provider]  atenolol (TENORMIN) 25 MG tablet Take 25 mg by mouth every morning.    [provider]  clobetasol ointment (TEMOVATE) 0.05 % Apply 1 application topically 2 (two) times daily as needed (for dryness on ears).    [provider]  fluconazole (DIFLUCAN) 100 MG tablet Take 100 mg by mouth daily. For 7 days 03/09/15   [provider]  fluticasone (FLONASE) 50 MCG/ACT nasal spray Place 2 sprays into both nostrils daily as needed for allergies or rhinitis.    [provider]  folic acid (FOLVITE) 1 MG tablet Take 1 mg by mouth daily.    [provider]  furosemide (LASIX) 20 MG tablet Take 20 mg by mouth daily.    [provider]  gabapentin (NEURONTIN) 100 MG capsule Take 100 mg by mouth 3 (three) times daily.    [provider]  Glycerin-Hypromellose-PEG 400 (HM DRY EYE RELIEF) 0.2-0.2-1 % SOLN Apply 1 drop to eye 2 (two) times daily as needed (for dry eyes).    [provider]  losartan (COZAAR) 50 MG tablet Take 50 mg by mouth daily.    [provider]  methotrexate (RHEUMATREX) 2.5 MG tablet Take 15 mg by mouth once a week. On Tuesday    [provider]  metroNIDAZOLE (METROGEL) 1 % gel Apply 1 application topically daily as needed (for itching).    [provider]  mometasone Derry Skill)  0.1 % cream Apply 1 application topically 2 (two) times daily as needed (for dryness on ears).    [provider]  Multiple Vitamins-Minerals (CENTRUM SILVER PO) Take 1 tablet by mouth daily.    [provider]  Olopatadine HCl (PAZEO) 0.7 % SOLN Place 1 drop into both eyes 2 (two) times daily.    [provider]  oxyCODONE-acetaminophen (PERCOCET) 10-325 MG tablet Take 1 tablet by mouth every 8 (eight) hours as needed for pain.    [provider]  pantoprazole (PROTONIX) 40 MG tablet Take 40 mg by mouth daily.    [provider]  predniSONE (DELTASONE) 5 MG  tablet Take 2.5 mg by mouth daily. 03/23/17   [provider]  simvastatin (ZOCOR) 20 MG tablet Take 20 mg by mouth at bedtime.    [provider]    Allergies  Allergen Reactions  . Ciprofloxacin Nausea Only  . Macrodantin  [Nitrofurantoin Macrocrystal]   . Penicillin V Potassium Other (See Comments)  . Sulfa Antibiotics Hives  . Nitrofurantoin Nausea Only, Other (See Comments) and Nausea And Vomiting    No family history on file.  Social History Social History   Tobacco Use  . Smoking status: Never Smoker  . Smokeless tobacco: Never Used  Substance Use Topics  . Alcohol use: Yes  . Drug use: No    Review of Systems Constitutional: Negative for fever.  Positive for generalized weakness Eyes: Negative for visual complaints ENT: Positive for congestion Cardiovascular: Negative for chest pain. Respiratory: Negative for shortness of breath.  Frequent cough with yellow sputum production Gastrointestinal: Negative for abdominal pain, vomiting and diarrhea. Genitourinary: Negative for urinary compaints.  Incontinence at baseline. Musculoskeletal: Negative for leg pain or swelling. Skin: Negative for skin complaints  Neurological: Negative for headache All other ROS negative  ____________________________________________   PHYSICAL EXAM:  VITAL SIGNS: ED Triage Vitals  Enc Vitals Group     BP 07/05/17 1607 (!) 112/54     Pulse Rate 07/05/17 1607 79     Resp 07/05/17 1607 (!) 24     Temp 07/05/17 1607 99.4 F (37.4 C)     Temp Source 07/05/17 1607 Oral     SpO2 07/05/17 1549 96 %     Weight 07/05/17 1612 140 lb (63.5 kg)     Height 07/05/17 1612 5\' 4"  (1.626 m)     Head Circumference --      Peak Flow --      Pain Score 07/05/17 1610 5     Pain Loc --      Pain Edu? --      Excl. in GC? --    Constitutional: Alert and oriented. Well appearing and in no distress. Eyes: Normal exam ENT   Head: Normocephalic and atraumatic.    Mouth/Throat: Mucous membranes are moist. Cardiovascular: Normal rate, regular rhythm.  Respiratory: Normal respiratory effort without tachypnea nor retractions.  Moderate diffuse rhonchi auscultated bilaterally some clearing with cough but not complete.  No wheeze or rales. Gastrointestinal: Soft and nontender. No distention.  Musculoskeletal: Mild to moderate right shoulder tenderness, moderate pain with attempted range of motion.  Neurovascular intact distally.  No lower extremity tenderness or edema.  Neurologic:  Normal speech and language. No gross focal neurologic deficits Skin:  Skin is warm, dry and intact.  Psychiatric: Mood and affect are normal.   ____________________________________________    EKG  EKG reviewed and interpreted by myself shows normal sinus rhythm 80 bpm with a narrow  QRS, normal axis, normal intervals, no concerning ST changes, nonspecific changes.  ____________________________________________    RADIOLOGY  Chest x-ray negative Shoulder x-ray negative  ____________________________________________   INITIAL IMPRESSION / ASSESSMENT AND PLAN / ED COURSE  Pertinent labs & imaging results that were available during my care of the patient were reviewed by me and considered in my medical decision making (see chart for details).  Patient presents emergency department with 7 days of cough, congestion now with right-sided chest pain generalized weakness unable to get out of bed today.  Lives at home with her daughter who has been helping her but states weakness has progressed to the point where she cannot get out of bed this afternoon so they called EMS to bring her to the emergency department for evaluation.  Here the patient appears well, no distress, calm, cooperative.  Has a frequent cough during exam and rhonchi on lung auscultation.  Differential at this time would include influenza, upper respiratory infection, bronchitis, pneumonia, left lateral metabolic  abnormality or other infectious etiology such as urinary tract infection.  We will check labs, urinalysis, chest x-ray and a right shoulder x-ray to further evaluate.  Imaging is negative.  Labs have resulted showing a significant urinary tract infection.  This is likely the cause of the patient's weakness.  We will start on IV Rocephin.  She states her penicillin allergy was a rash.  We will send blood cultures and the urine culture and admit to the hospitalist service for further treatment.  Influenza resulted negative.  ____________________________________________   FINAL CLINICAL IMPRESSION(S) / ED DIAGNOSES  Weakness Urinary tract infection    Minna Antis, MD 07/05/17 Windell Moment

## 2017-07-05 NOTE — ED Notes (Signed)
IV not infusing and mass palpated at catheter tip, Nate, pharm called, recommends "ice pack - probable not vesicant"

## 2017-07-05 NOTE — ED Triage Notes (Signed)
EMS reports increasing weakness since x 4 days. Pt fell x 3 days ago, fx R shoulder. Pt has been coughing since Thursday and this has gotten worse. Pt is now unable to get out of her bed or wheelchair at home. Pt has daughter at home to help her, but daughter is unable to assist w/ transfer or ambulation.

## 2017-07-05 NOTE — H&P (Signed)
[employer] [facility] [place] [facility]   PATIENT NAME: Leslie Lamb    MR#:  [id number]  DATE OF BIRTH:  1923/07/16  DATE OF ADMISSION:  07/05/2017  PRIMARY CARE PHYSICIAN: Gracelyn Nurse, MD   REQUESTING/REFERRING PHYSICIAN: Dr. Minna Antis  CHIEF COMPLAINT:   Chief Complaint  Patient presents with  . Weakness    HISTORY OF PRESENT ILLNESS:  Leslie Lamb  is a 84 y.o. female with a known history of dementia, hypertension, hyperlipidemia, rheumatoid arthritis who presents to the hospital due to weakness, fall. Patient at baseline is wheelchair bound but had a fall from her wheelchair 2 days ago and has been progressively declining since then. As per the daughter who gives most of the history since the fall patient has been daily been able to get from her bed to her wheelchair. She had to call EMS yesterday to get her up from the toilet. Patient did have a cough with some laryngitis but a week or so ago which has improved except the cough has lingered. Given the progressive decline she brought her to the ER for further evaluation. Patient does have a history of recurrent urinary tract infection, and was noted to be positive for UTI with based on her urinalysis. She is afebrile, hemodynamically stable. Hospitalist services were contacted further treatment and evaluation.  PAST MEDICAL HISTORY:   Past Medical History:  Diagnosis Date  . Bursitis   . Hx of spinal fusion   . Hypercholesteremia   . Hypertension     PAST SURGICAL HISTORY:   Past Surgical History:  Procedure Laterality Date  . APPENDECTOMY    . CHOLECYSTECTOMY    . SPINAL FUSION    . TONSILLECTOMY    . TOTAL VAGINAL HYSTERECTOMY      SOCIAL HISTORY:   Social History   Tobacco Use  . Smoking status: Never Smoker  . Smokeless tobacco: Never Used  Substance Use Topics  . Alcohol use: Yes    FAMILY HISTORY:  No family history on file.  DRUG ALLERGIES:   Allergies   Allergen Reactions  . Ciprofloxacin Nausea Only  . Macrodantin  [Nitrofurantoin Macrocrystal]   . Penicillin V Potassium Other (See Comments)  . Sulfa Antibiotics Hives  . Nitrofurantoin Nausea Only, Other (See Comments) and Nausea And Vomiting    REVIEW OF SYSTEMS:   Review of Systems  Constitutional: Negative for fever and weight loss.  HENT: Negative for congestion, nosebleeds and tinnitus.   Eyes: Negative for blurred vision, double vision and redness.  Respiratory: Positive for cough. Negative for hemoptysis and shortness of breath.   Cardiovascular: Negative for chest pain, orthopnea, leg swelling and PND.  Gastrointestinal: Negative for abdominal pain, diarrhea, melena, nausea and vomiting.  Genitourinary: Negative for dysuria, hematuria and urgency.  Musculoskeletal: Positive for falls and joint pain (Right Shoulder Pain).  Neurological: Positive for weakness. Negative for dizziness, tingling, sensory change, focal weakness, seizures and headaches.  Endo/Heme/Allergies: Negative for polydipsia. Does not bruise/bleed easily.  Psychiatric/Behavioral: Negative for depression and memory loss. The patient is not nervous/anxious.     MEDICATIONS AT HOME:   Prior to Admission medications   Medication Sig Start Date End Date Taking? Authorizing Provider  aspirin EC 81 MG tablet Take 81 mg by mouth daily.   Yes [provider]  atenolol (TENORMIN) 25 MG tablet Take 25 mg by mouth every morning.   Yes [provider]  donepezil (ARICEPT) 5 MG tablet Take 1 tablet by mouth daily. 06/20/17  Yes [provider]  folic acid (FOLVITE) 1 MG tablet Take 1 mg by mouth daily.   Yes [provider]  furosemide (LASIX) 20 MG tablet Take 20 mg by mouth daily.   Yes [provider]  Glycerin-Hypromellose-PEG 400 (HM DRY EYE RELIEF) 0.2-0.2-1 % SOLN Apply 1 drop to eye 2 (two) times daily as needed (for dry eyes).   Yes [provider]  losartan  (COZAAR) 50 MG tablet Take 50 mg by mouth daily.   Yes [provider]  methotrexate (RHEUMATREX) 2.5 MG tablet Take 15 mg by mouth once a week. On Tuesday   Yes [provider]  Multiple Vitamins-Minerals (CENTRUM SILVER PO) Take 1 tablet by mouth daily.   Yes [provider]  Olopatadine HCl (PAZEO) 0.7 % SOLN Place 1 drop into both eyes 2 (two) times daily.   Yes [provider]  pantoprazole (PROTONIX) 40 MG tablet Take 40 mg by mouth daily.   Yes [provider]  predniSONE (DELTASONE) 5 MG tablet Take 2.5 mg by mouth daily. 03/23/17  Yes [provider]  simvastatin (ZOCOR) 20 MG tablet Take 20 mg by mouth at bedtime.   Yes [provider]  albuterol (PROVENTIL HFA;VENTOLIN HFA) 108 (90 Base) MCG/ACT inhaler Inhale 2 puffs into the lungs every 6 (six) hours as needed for wheezing or shortness of breath. 05/25/17   Erin Fulling, MD  clobetasol ointment (TEMOVATE) 0.05 % Apply 1 application topically 2 (two) times daily as needed (for dryness on ears).    [provider]  fluticasone (FLONASE) 50 MCG/ACT nasal spray Place 2 sprays into both nostrils daily as needed for allergies or rhinitis.    [provider]      VITAL SIGNS:  Blood pressure 127/62, pulse 71, temperature 99.4 F (37.4 C), temperature source Oral, resp. rate 20, height 5\' 4"  (1.626 m), weight 63.5 kg (140 lb), SpO2 94 %.  PHYSICAL EXAMINATION:  Physical Exam  GENERAL:  84 y.o.-year-old patient lying in the bed in no acute distress.  EYES: Pupils equal, round, reactive to light and accommodation. No scleral icterus. Extraocular muscles intact.  HEENT: Head atraumatic, normocephalic. Oropharynx and nasopharynx clear. No oropharyngeal erythema, dry oral mucosa  NECK:  Supple, no jugular venous distention. No thyroid enlargement, no tenderness.  LUNGS: Normal breath sounds bilaterally, no wheezing, rales, rhonchi. No use of accessory muscles of  respiration.  CARDIOVASCULAR: S1, S2 RRR. No murmurs, rubs, gallops, clicks.  ABDOMEN: Soft, nontender, nondistended. Bowel sounds present. No organomegaly or mass.  EXTREMITIES: No pedal edema, cyanosis, or clubbing. + 2 pedal & radial pulses b/l.   NEUROLOGIC: Cranial nerves II through XII are intact. No focal Motor or sensory deficits appreciated b/l, globally weak PSYCHIATRIC: The patient is alert and oriented x 1.   SKIN: No obvious rash, lesion, or ulcer.   LABORATORY PANEL:   CBC Recent Labs  Lab 07/05/17 1714  WBC 8.9  HGB 11.3*  HCT 33.6*  PLT 154   ------------------------------------------------------------------------------------------------------------------  Chemistries  Recent Labs  Lab 07/05/17 1714  NA 136  K 3.7  CL 101  CO2 27  GLUCOSE 116*  BUN 21*  CREATININE 0.92  CALCIUM 8.6*  AST 44*  ALT 21  ALKPHOS 58  BILITOT 0.9   ------------------------------------------------------------------------------------------------------------------  Cardiac Enzymes Recent Labs  Lab 07/05/17 1714  TROPONINI 0.03*   ------------------------------------------------------------------------------------------------------------------  RADIOLOGY:  Dg Chest 2 View  Result Date: 07/05/2017 CLINICAL DATA:  Weakness for 4 days. EXAM: CHEST  2 VIEW COMPARISON:  05/14/2017 and prior chest radiographs FINDINGS: The cardiomediastinal silhouette is unremarkable. There is no evidence of focal airspace disease, pulmonary edema, suspicious pulmonary nodule/mass, pleural effusion, or pneumothorax. No acute bony abnormalities are identified. Degenerative changes within both shoulders again noted. Surgical changes within the UPPER lumbar spine identified. IMPRESSION: No active cardiopulmonary disease. Electronically Signed   By: Harmon Pier M.D.   On: 07/05/2017 17:39   Dg Shoulder Right  Result Date: 07/05/2017 CLINICAL DATA:  Acute RIGHT shoulder pain following fall 3 days  ago. Initial encounter. EXAM: RIGHT SHOULDER - 2+ VIEW COMPARISON:  05/14/2017 and prior chest radiographs FINDINGS: There is no evidence of acute fracture or dislocation. Degenerative changes within the RIGHT shoulder are again noted. The shoulder has a similar appearance to prior radiographs. IMPRESSION: No evidence of acute abnormality.  Degenerative changes. Electronically Signed   By: Harmon Pier M.D.   On: 07/05/2017 17:41     IMPRESSION AND PLAN:   84 year old female with past medical history of hypertension, hyperlipidemia, dementia, history of rheumatoid arthritis presents to the hospital due to weakness, fall.  1. Urinary tract infection-this is a cause of patient's worsening weakness and also recent fall. -Patient has a history of recurrent urinary tract infection given her urinary incontinence. She follows up with urology. She recently finished an antibiotic but is unclear which one she took. -I will empirically start her on IV ceftriaxone, follow urine cultures.  2. Weakness/fall-multifactorial in nature related to patient's underlying dementia, deconditioning and also underlying UTI. Patient at baseline is wheelchair bound but can't even get herself from bed to wheelchair presently. -We'll get physical therapy consult to assess her mobility. Treat underlying UTI with antibiotics as mentioned above. Patient may need short-term rehabilitation upon discharge. Social work has been consulted.  3. History of rheumatoid arthritis-continue methotrexate, prednisone.  4. Essential hypertension-continue atenolol, losartan.  5. Dementia-continue Aricept.  6. GERD-continue Protonix.  7. Hyperlipidemia-continue simvastatin.    All the records are reviewed and case discussed with ED provider. Management plans discussed with the patient, family and they are in agreement.  CODE STATUS: DO NOT RESUSCITATE  TOTAL TIME TAKING CARE OF THIS PATIENT: 40 minutes.    Houston Siren M.D on  07/05/2017 at 7:16 PM  Between 7am to 6pm - Pager - [phone]  After 6pm go to [email or web address] - password [email or web address] [facility]  [facility]  Office  [phone]  CC: Primary care physician; Gracelyn Nurse, MD

## 2017-07-05 NOTE — ED Notes (Signed)
Patient transported to X-ray 

## 2017-07-06 LAB — CBC
HCT: 28.1 % — ABNORMAL LOW (ref 35.0–47.0)
Hemoglobin: 9.5 g/dL — ABNORMAL LOW (ref 12.0–16.0)
MCH: 33.2 pg (ref 26.0–34.0)
MCHC: 33.8 g/dL (ref 32.0–36.0)
MCV: 98.4 fL (ref 80.0–100.0)
PLATELETS: 146 10*3/uL — AB (ref 150–440)
RBC: 2.85 MIL/uL — ABNORMAL LOW (ref 3.80–5.20)
RDW: 15.5 % — ABNORMAL HIGH (ref 11.5–14.5)
WBC: 9.2 10*3/uL (ref 3.6–11.0)

## 2017-07-06 LAB — BASIC METABOLIC PANEL
Anion gap: 13 (ref 5–15)
BUN: 18 mg/dL (ref 6–20)
CALCIUM: 7.8 mg/dL — AB (ref 8.9–10.3)
CO2: 21 mmol/L — ABNORMAL LOW (ref 22–32)
CREATININE: 0.82 mg/dL (ref 0.44–1.00)
Chloride: 105 mmol/L (ref 101–111)
GFR calc Af Amer: >60 mL/min (ref >60)
Glucose, Bld: 91 mg/dL (ref 65–99)
Potassium: 3.2 mmol/L — ABNORMAL LOW (ref 3.5–5.1)
Sodium: 139 mmol/L (ref 135–145)

## 2017-07-06 MED ORDER — ATENOLOL 25 MG PO TABS
12.5000 mg | ORAL_TABLET | Freq: Every morning | ORAL | Status: DC
  Administered 2017-07-06 – 2017-07-07 (×2): 12.5 mg via ORAL
  Filled 2017-07-06 (×2): qty 0.5

## 2017-07-06 MED ORDER — CEPHALEXIN 250 MG PO CAPS: 250.0000 mg | ORAL_CAPSULE | Freq: Three times a day (TID) | ORAL | 0 refills | Status: DC

## 2017-07-06 MED ORDER — SODIUM CHLORIDE 0.9 % IV SOLN
INTRAVENOUS | Status: DC
  Administered 2017-07-06: 09:00:00 via INTRAVENOUS

## 2017-07-06 MED ORDER — SODIUM CHLORIDE 0.9 % IV BOLUS (SEPSIS)
1000.0000 mL | Freq: Once | INTRAVENOUS | Status: AC
  Administered 2017-07-06: 1000 mL via INTRAVENOUS

## 2017-07-06 MED ORDER — POTASSIUM CHLORIDE CRYS ER 20 MEQ PO TBCR
40.0000 meq | EXTENDED_RELEASE_TABLET | ORAL | Status: AC
  Administered 2017-07-06 (×2): 40 meq via ORAL
  Filled 2017-07-06 (×2): qty 2

## 2017-07-06 NOTE — Progress Notes (Signed)
Notified PRIME doc by text about low bp.  Await orders. Henriette Combs RN

## 2017-07-06 NOTE — Clinical Social Work Note (Signed)
CSW spoke with patient's daughter Dondra Spry who was at bedside, they would like to pursue SNF for short term rehab, and will pay privately.  CSW explained to them what the cost would be, and patient's daughter expressed understanding.  CSW faxed patient's information to SNFs in [place].  Ervin Knack. Shaquasia Caponigro, MSW, Theresia Majors [phone]  07/06/2017 2:25 PM

## 2017-07-06 NOTE — Evaluation (Signed)
Physical Therapy Evaluation Patient Details Name: Leslie Lamb MRN: [id number] DOB: 06-16-1923 Today's Date: 07/06/2017   History of Present Illness  presented to ER secondary to progressive weakness and fall; admitted for management of UTI.  Clinical Impression  Upon evaluation, patient alert and oriented; follows simple commands and demonstrates fair/good safety awareness with all functional activities.  R shoulder with significant (baseline) deficits for elevation in all planes; otherwise, bilat UE/LE strength and ROM grossly WFL for basic transfers and mobility.  Able to complete bed mobility, sit/stand and basic transfers (SPT without assist device), cga/min assist from therapist.  Does require bilat UE support throughout, but demonstrates good awareness of technique and safety needs.  Can complete bilat, but prefers transfer towards L due to R shoulder difficulty. Would benefit from skilled PT to address above deficits and promote optimal return to PLOF; Recommend transition to HHPT upon discharge from acute hospitalization.     Follow Up Recommendations Home health PT    Equipment Recommendations  3in1 (PT)    Recommendations for Other Services       Precautions / Restrictions Precautions Precautions: Fall Restrictions Weight Bearing Restrictions: No      Mobility  Bed Mobility Overal bed mobility: Needs Assistance Bed Mobility: Supine to Sit     Supine to sit: Min guard;Min assist        Transfers Overall transfer level: Needs assistance   Transfers: Stand Pivot Transfers   Stand pivot transfers: Min guard;Min assist       General transfer comment: requires bilat UE support at all times, but demonstrates good technique and awareness of safety needs throughout transfer.  Can complete bilat, but prefers transfer towards L due to R shoulder pain/weakness.  Ambulation/Gait             General Gait Details: unsafe/unable; non-ambulatory at  baseline  Stairs            Wheelchair Mobility    Modified Rankin (Stroke Patients Only)       Balance Overall balance assessment: Needs assistance Sitting-balance support: No upper extremity supported;Feet supported Sitting balance-Leahy Scale: Good     Standing balance support: Bilateral upper extremity supported Standing balance-Leahy Scale: Fair                               Pertinent Vitals/Pain Pain Assessment: Faces Faces Pain Scale: Hurts little more Pain Location: R shoulder  Pain Descriptors / Indicators: Aching;Grimacing;Guarding Pain Intervention(s): Limited activity within patient's tolerance;Monitored during session;Repositioned    Home Living Family/patient expects to be discharged to:: Private residence Living Arrangements: Children Available Help at Discharge: Family Type of Home: House Home Access: Stairs to enter Entrance Stairs-Rails: None Secretary/administrator of Steps: 1 Home Layout: One level Home Equipment: Wheelchair - manual      Prior Function Level of Independence: Needs assistance         Comments: Patient WC level as primary mobility at baseline (unable to clearly articulate length of time or reason for transition).  Sup/mod indep for basic transfers to/from manual WC (SPT without assist device); does take 3-4 steps into bathroom for toileting (WC does not fit through bathroom door).  Family assists with household chores, ADLs as needed.     Hand Dominance   Dominant Hand: Right    Extremity/Trunk Assessment   Upper Extremity Assessment Upper Extremity Assessment: (R shoulder elevation very limited, requiring act assist from L UE for elevation in  all planes; elbow, wrist and fingers at least 3/5.  L UE grossly WFL)    Lower Extremity Assessment Lower Extremity Assessment: (bilat LEs grossly at least 4-/5 throughout)       Communication   Communication: HOH  Cognition Arousal/Alertness:  Awake/alert Behavior During Therapy: WFL for tasks assessed/performed Overall Cognitive Status: Within Functional Limits for tasks assessed                                        General Comments      Exercises Other Exercises Other Exercises: Toilet transfer, SPT to/from [facility], cga/min assist; requires UE support at all times.  Close sup/set up for hygiene (performed in seated position). May benefit from [facility] at home to prevent need to ambulate into bathroom.   Assessment/Plan    PT Assessment Patient needs continued PT services  PT Problem List Decreased strength;Decreased activity tolerance;Decreased balance;Decreased mobility;Decreased coordination       PT Treatment Interventions DME instruction;Therapeutic exercise;Functional mobility training;Therapeutic activities;Balance training;Patient/family education    PT Goals (Current goals can be found in the Care Plan section)  Acute Rehab PT Goals Patient Stated Goal: to return home PT Goal Formulation: With patient Time For Goal Achievement: 07/20/17 Potential to Achieve Goals: Good    Frequency Min 2X/week   Barriers to discharge        Co-evaluation               AM-PAC PT "6 Clicks" Daily Activity  Outcome Measure Difficulty turning over in bed (including adjusting bedclothes, sheets and blankets)?: Unable Difficulty moving from lying on back to sitting on the side of the bed? : Unable Difficulty sitting down on and standing up from a chair with arms (e.g., wheelchair, bedside commode, etc,.)?: Unable Help needed moving to and from a bed to chair (including a wheelchair)?: A Little Help needed walking in hospital room?: Total Help needed climbing 3-5 steps with a railing? : Total 6 Click Score: 8    End of Session Equipment Utilized During Treatment: Gait belt Activity Tolerance: Patient tolerated treatment well Patient left: in chair;with call bell/phone within reach;with chair alarm  set Nurse Communication: Mobility status PT Visit Diagnosis: Unsteadiness on feet (R26.81);History of falling (Z91.81);Muscle weakness (generalized) (M62.81);Difficulty in walking, not elsewhere classified (R26.2)    Time: 8315-1761 PT Time Calculation (min) (ACUTE ONLY): 23 min   Charges:   PT Evaluation $PT Eval Low Complexity: 1 Low PT Treatments $Therapeutic Activity: 8-22 mins   PT G Codes:       Betzayda Braxton H. Manson Passey, PT, DPT, NCS 07/06/17, 11:04 AM [phone]

## 2017-07-06 NOTE — Discharge Instructions (Addendum)
Resume diet and activity as before ° ° °

## 2017-07-06 NOTE — Care Management (Signed)
Spoke with daughter Dondra Spry. Offered home health PT. She is interested and working with the CSW on transfer to a SNF. Will sign off at this time. Please reconsult if needs arise.

## 2017-07-06 NOTE — Clinical Social Work Note (Addendum)
CSW presented bed offers to patient's daughter Leslie Lamb, she agreed to private pay for [name].  CSW spoke to [name] and they can accept patient on Tuesday.  CSW updated bedside nurse who spoke to the physician and discharge was cancelled for today.  Ervin Knack. Mykell Genao, MSW, Theresia Majors [phone]  07/06/2017 5:10 PM

## 2017-07-06 NOTE — NC FL2 (Addendum)
[place] MEDICAID FL2 LEVEL OF CARE SCREENING TOOL     IDENTIFICATION  Patient Name: Leslie Lamb Birthdate: 03/30/1924 Sex: female Admission Date (Current Location): 07/05/2017  [place] and [place] Number:  Chiropodist and Address:  [facility], [address], [place], [place] [address]      Provider Number: [id number]  Attending Physician Name and Address:  Milagros Loll, MD  Relative Name and Phone Number:  Fipps,Janice Daughter   [phone]   Deauna, Yaw   [phone]   Dorna Leitz   [phone]   Fiddyment,Gail Daughter   [phone]     Current Level of Care: Hospital Recommended Level of Care: Skilled Nursing Facility Prior Approval Number:    Date Approved/Denied:   PASRR Number: [id number]  Discharge Plan: SNF    Current Diagnoses: Patient Active Problem List   Diagnosis Date Noted  . UTI (urinary tract infection) 07/05/2017  . Disorder of bursae of shoulder region 06/09/2017  . Urge incontinence 03/31/2014    Orientation RESPIRATION BLADDER Height & Weight     Self  Normal Incontinent Weight: 135 lb 6.4 oz (61.4 kg) Height:  5\' 4"  (162.6 cm)  BEHAVIORAL SYMPTOMS/MOOD NEUROLOGICAL BOWEL NUTRITION STATUS      Continent Diet(Cardiac)  AMBULATORY STATUS COMMUNICATION OF NEEDS Skin   Limited Assist Verbally Normal                       Personal Care Assistance Level of Assistance  Bathing, Feeding, Dressing Bathing Assistance: Limited assistance Feeding assistance: Independent Dressing Assistance: Limited assistance     Functional Limitations Info  Sight, Hearing, Speech Sight Info: Adequate Hearing Info: Adequate Speech Info: Adequate    SPECIAL CARE FACTORS FREQUENCY  PT (By licensed PT)     PT Frequency: 5x a week              Contractures      Additional Factors Info  Code Status, Allergies Code Status Info: DNR Allergies Info: CIPROFLOXACIN, Macrodantin   NITROFURANTOIN MACROCRYSTAL, PENICILLIN V POTASSIUM, SULFA ANTIBIOTICS, NITROFURANTOIN            Current Medications (07/06/2017):  This is the current hospital active medication list Current Facility-Administered Medications  Medication Dose Route Frequency Provider Last Rate Last Dose  . acetaminophen (TYLENOL) tablet 650 mg  650 mg Oral Q6H PRN 07/08/2017, MD       Or  . acetaminophen (TYLENOL) suppository 650 mg  650 mg Rectal Q6H PRN Sainani, Houston Siren, MD      . albuterol (PROVENTIL) (2.5 MG/3ML) 0.083% nebulizer solution 3 mL  3 mL Inhalation Q6H PRN Rolly Pancake, MD      . aspirin EC tablet 81 mg  81 mg Oral Daily Houston Siren, MD   81 mg at 07/06/17 0848  . atenolol (TENORMIN) tablet 12.5 mg  12.5 mg Oral q morning - 10a 07/08/17, MD   12.5 mg at 07/06/17 0849  . cefTRIAXone (ROCEPHIN) 1 g in sodium chloride 0.9 % 100 mL IVPB  1 g Intravenous Q24H Sainani, Vivek J, MD      . clobetasol ointment (TEMOVATE) 0.05 % 1 application  1 application Topical BID PRN 07/08/17, MD      . donepezil (ARICEPT) tablet 5 mg  5 mg Oral Daily Houston Siren, MD   5 mg at 07/06/17 0849  . enoxaparin (LOVENOX) injection 40 mg  40 mg Subcutaneous Q24H Sainani, Vivek J,  MD   40 mg at 07/05/17 2225  . fluticasone (FLONASE) 50 MCG/ACT nasal spray 2 spray  2 spray Each Nare Daily PRN Houston Siren, MD      . folic acid (FOLVITE) tablet 1 mg  1 mg Oral Daily Houston Siren, MD   1 mg at 07/06/17 0848  . olopatadine (PATANOL) 0.1 % ophthalmic solution 1 drop  1 drop Both Eyes BID Houston Siren, MD   1 drop at 07/06/17 0850  . ondansetron (ZOFRAN) tablet 4 mg  4 mg Oral Q6H PRN Houston Siren, MD       Or  . ondansetron (ZOFRAN) injection 4 mg  4 mg Intravenous Q6H PRN Houston Siren, MD      . pantoprazole (PROTONIX) EC tablet 40 mg  40 mg Oral Daily Houston Siren, MD   40 mg at 07/06/17 0848  . polyvinyl alcohol (LIQUIFILM TEARS) 1.4 % ophthalmic solution 1 drop  1  drop Both Eyes PRN Sainani, Rolly Pancake, MD      . potassium chloride SA (K-DUR,KLOR-CON) CR tablet 40 mEq  40 mEq Oral Q4H Milagros Loll, MD   40 mEq at 07/06/17 0848  . predniSONE (DELTASONE) tablet 2.5 mg  2.5 mg Oral Daily Houston Siren, MD   2.5 mg at 07/06/17 0849  . simvastatin (ZOCOR) tablet 20 mg  20 mg Oral QHS Houston Siren, MD   20 mg at 07/05/17 2225     Discharge Medications: Please see discharge summary for a list of discharge medications.  Relevant Imaging Results:  Relevant Lab Results:   Additional Information SSN [id number]  Darleene Cleaver, [place]

## 2017-07-07 MED ORDER — CEPHALEXIN 250 MG PO CAPS: 250.0000 mg | ORAL_CAPSULE | Freq: Three times a day (TID) | ORAL | 0 refills | Status: AC

## 2017-07-07 NOTE — Discharge Summary (Signed)
[facility] - [place] [facility]   PATIENT NAME: Leslie Lamb    MR#:  [id number]  DATE OF BIRTH:  [birth year]  DATE OF ADMISSION:  07/05/2017 ADMITTING PHYSICIAN: Houston Siren, MD  DATE OF DISCHARGE: 07/07/2017  PRIMARY CARE PHYSICIAN: Gracelyn Nurse, MD   ADMISSION DIAGNOSIS:  Lower urinary tract infectious disease [N39.0] Weakness [R53.1]  DISCHARGE DIAGNOSIS:  Active Problems:   UTI (urinary tract infection)   SECONDARY DIAGNOSIS:   Past Medical History:  Diagnosis Date  . Bursitis   . Hx of spinal fusion   . Hypercholesteremia   . Hypertension      ADMITTING HISTORY  HISTORY OF PRESENT ILLNESS:  Leslie Lamb  is a 84 y.o. female with a known history of dementia, hypertension, hyperlipidemia, rheumatoid arthritis who presents to the hospital due to weakness, fall. Patient at baseline is wheelchair bound but had a fall from her wheelchair 2 days ago and has been progressively declining since then. As per the daughter who gives most of the history since the fall patient has been daily been able to get from her bed to her wheelchair. She had to call EMS yesterday to get her up from the toilet. Patient did have a cough with some laryngitis but a week or so ago which has improved except the cough has lingered. Given the progressive decline she brought her to the ER for further evaluation. Patient does have a history of recurrent urinary tract infection, and was noted to be positive for UTI with based on her urinalysis. She is afebrile, hemodynamically stable. Hospitalist services were contacted further treatment and evaluation.  HOSPITAL COURSE:   * UTI Cultures negative.  Patient did have typical symptoms.  Will treat with Keflex for 5 more days after discharge orally.  *Generalized weakness.  Due to UTI and also progressive worsening with age/dementia.  Patient is being discharged to skilled nursing facility today.  With full weightbearing  status  *Dementia.  Had mild inpatient delirium which is resolved.  Patient stable for discharge to skilled nursing facility for further physical therapy.  Will likely need transition to long-term care.  CONSULTS OBTAINED:    DRUG ALLERGIES:   Allergies  Allergen Reactions  . Ciprofloxacin Nausea Only  . Macrodantin  [Nitrofurantoin Macrocrystal]   . Penicillin V Potassium Other (See Comments)  . Sulfa Antibiotics Hives  . Nitrofurantoin Nausea Only, Other (See Comments) and Nausea And Vomiting    DISCHARGE MEDICATIONS:   Allergies as of 07/07/2017      Reactions   Ciprofloxacin Nausea Only   Macrodantin  [nitrofurantoin Macrocrystal]    Penicillin V Potassium Other (See Comments)   Sulfa Antibiotics Hives   Nitrofurantoin Nausea Only, Other (See Comments), Nausea And Vomiting      Medication List    TAKE these medications   albuterol 108 (90 Base) MCG/ACT inhaler Commonly known as:  PROVENTIL HFA;VENTOLIN HFA Inhale 2 puffs into the lungs every 6 (six) hours as needed for wheezing or shortness of breath.   aspirin EC 81 MG tablet Take 81 mg by mouth daily.   atenolol 25 MG tablet Commonly known as:  TENORMIN Take 25 mg by mouth every morning.   CENTRUM SILVER PO Take 1 tablet by mouth daily.   cephALEXin 250 MG capsule Commonly known as:  KEFLEX Take 1 capsule (250 mg total) by mouth 3 (three) times daily for 5 days.   clobetasol ointment 0.05 % Commonly known as:  TEMOVATE Apply 1 application topically  2 (two) times daily as needed (for dryness on ears).   donepezil 5 MG tablet Commonly known as:  ARICEPT Take 1 tablet by mouth daily.   fluticasone 50 MCG/ACT nasal spray Commonly known as:  FLONASE Place 2 sprays into both nostrils daily as needed for allergies or rhinitis.   folic acid 1 MG tablet Commonly known as:  FOLVITE Take 1 mg by mouth daily.   furosemide 20 MG tablet Commonly known as:  LASIX Take 20 mg by mouth daily.   HM DRY EYE  RELIEF 0.2-0.2-1 % Soln Generic drug:  Glycerin-Hypromellose-PEG 400 Apply 1 drop to eye 2 (two) times daily as needed (for dry eyes).   losartan 50 MG tablet Commonly known as:  COZAAR Take 50 mg by mouth daily.   methotrexate 2.5 MG tablet Commonly known as:  RHEUMATREX Take 15 mg by mouth once a week. On Tuesday   pantoprazole 40 MG tablet Commonly known as:  PROTONIX Take 40 mg by mouth daily.   PAZEO 0.7 % Soln Generic drug:  Olopatadine HCl Place 1 drop into both eyes 2 (two) times daily.   predniSONE 5 MG tablet Commonly known as:  DELTASONE Take 2.5 mg by mouth daily.   simvastatin 20 MG tablet Commonly known as:  ZOCOR Take 20 mg by mouth at bedtime.       Today   VITAL SIGNS:  Blood pressure (!) 145/72, pulse 90, temperature 99.4 F (37.4 C), temperature source Oral, resp. rate 15, height 5\' 4"  (1.626 m), weight 61.4 kg (135 lb 6.4 oz), SpO2 98 %.  I/O:    Intake/Output Summary (Last 24 hours) at 07/07/2017 0831 Last data filed at 07/06/2017 2307 Gross per 24 hour  Intake 100 ml  Output 250 ml  Net -150 ml    PHYSICAL EXAMINATION:  Physical Exam  GENERAL:  84 y.o.-year-old patient lying in the bed with no acute distress.  LUNGS: Normal breath sounds bilaterally, no wheezing, rales,rhonchi or crepitation. No use of accessory muscles of respiration.  CARDIOVASCULAR: S1, S2 normal. No murmurs, rubs, or gallops.  ABDOMEN: Soft, non-tender, non-distended. Bowel sounds present. No organomegaly or mass.  NEUROLOGIC: Moves all 4 extremities. PSYCHIATRIC: The patient is alert and awake. SKIN: No obvious rash, lesion, or ulcer.   DATA REVIEW:   CBC Recent Labs  Lab 07/06/17 0403  WBC 9.2  HGB 9.5*  HCT 28.1*  PLT 146*    Chemistries  Recent Labs  Lab 07/05/17 1714 07/06/17 0403  NA 136 139  K 3.7 3.2*  CL 101 105  CO2 27 21*  GLUCOSE 116* 91  BUN 21* 18  CREATININE 0.92 0.82  CALCIUM 8.6* 7.8*  AST 44*  --   ALT 21  --   ALKPHOS 58   --   BILITOT 0.9  --     Cardiac Enzymes Recent Labs  Lab 07/05/17 1714  TROPONINI 0.03*    Microbiology Results  Results for orders placed or performed during the hospital encounter of 07/05/17  Blood culture (routine x 2)     Status: None (Preliminary result)   Collection Time: 07/05/17  6:40 PM  Result Value Ref Range Status   Specimen Description BLOOD BLOOD LEFT FOREARM  Final   Special Requests   Final    BOTTLES DRAWN AEROBIC AND ANAEROBIC Blood Culture adequate volume   Culture   Final    NO GROWTH 2 DAYS Performed at [facility], [address] [place], [place]  Report Status PENDING  Incomplete  Blood culture (routine x 2)     Status: None (Preliminary result)   Collection Time: 07/05/17  6:40 PM  Result Value Ref Range Status   Specimen Description BLOOD LEFT ANTECUBITAL  Final   Special Requests   Final    BOTTLES DRAWN AEROBIC AND ANAEROBIC Blood Culture results may not be optimal due to an excessive volume of blood received in culture bottles   Culture   Final    NO GROWTH 2 DAYS Performed at [facility], [address], [place], [place] [address]    Report Status PENDING  Incomplete    RADIOLOGY:  Dg Chest 2 View  Result Date: 07/05/2017 CLINICAL DATA:  Weakness for 4 days. EXAM: CHEST  2 VIEW COMPARISON:  05/14/2017 and prior chest radiographs FINDINGS: The cardiomediastinal silhouette is unremarkable. There is no evidence of focal airspace disease, pulmonary edema, suspicious pulmonary nodule/mass, pleural effusion, or pneumothorax. No acute bony abnormalities are identified. Degenerative changes within both shoulders again noted. Surgical changes within the UPPER lumbar spine identified. IMPRESSION: No active cardiopulmonary disease. Electronically Signed   By: Harmon Pier M.D.   On: 07/05/2017 17:39   Dg Shoulder Right  Result Date: 07/05/2017 CLINICAL DATA:  Acute RIGHT shoulder pain following fall 3 days ago. Initial  encounter. EXAM: RIGHT SHOULDER - 2+ VIEW COMPARISON:  05/14/2017 and prior chest radiographs FINDINGS: There is no evidence of acute fracture or dislocation. Degenerative changes within the RIGHT shoulder are again noted. The shoulder has a similar appearance to prior radiographs. IMPRESSION: No evidence of acute abnormality.  Degenerative changes. Electronically Signed   By: Harmon Pier M.D.   On: 07/05/2017 17:41    Follow up with PCP in 1 week.  Management plans discussed with the patient, family and they are in agreement.  CODE STATUS:     Code Status Orders  (From admission, onward)        Start     Ordered   07/05/17 2151  Do not attempt resuscitation (DNR)  Continuous    Question Answer Comment  In the event of cardiac or respiratory ARREST Do not call a "code blue"   In the event of cardiac or respiratory ARREST Do not perform Intubation, CPR, defibrillation or ACLS   In the event of cardiac or respiratory ARREST Use medication by any route, position, wound care, and other measures to relive pain and suffering. May use oxygen, suction and manual treatment of airway obstruction as needed for comfort.      07/05/17 2150    Code Status History    Date Active Date Inactive Code Status Order ID Comments User Context   This patient has a current code status but no historical code status.    Advance Directive Documentation     Most Recent Value  Type of Advance Directive  Healthcare Power of Attorney, Living will, Out of facility DNR (pink MOST or yellow form)  Pre-existing out of facility DNR order (yellow form or pink MOST form)  No data  "MOST" Form in Place?  No data      TOTAL TIME TAKING CARE OF THIS PATIENT ON DAY OF DISCHARGE: more than 30 minutes.   Molinda Bailiff Ruthanna Macchia M.D on 07/07/2017 at 8:31 AM  Between 7am to 6pm - Pager - [phone]  After 6pm go to [email or web address] - password [email or web address] [facility]  [facility] [place] [facility]  Office  [phone]  CC: Primary care  physician; Gracelyn Nurse, MD  Note: This dictation was prepared with Dragon dictation along with smaller phrase technology. Any transcriptional errors that result from this process are unintentional.

## 2017-07-07 NOTE — Clinical Social Work Placement (Addendum)
11:52 AM LCSW was called into room as family had concerns related to patient going in the car. Family reports they were under the impression from [facility] that LCSW was setting up transportation. LCSW explained that on 2/25 and 2/26 that LCSW had spoken with Dondra Spry regarding transport and reports family was coming to hospital to pick up.   Son reports " she has a broken shoulder, uti, and she is weak, she cannot go in the car".  LCSW explained we can arranged an EMS however due to patient being able to sit in wheel chair as well as in a chair that insurance may not authorize the transport requiring family to pay a bill.  Son reports that he feels she is unsafe and LCSW validated feelings and again explained medical necessity form and answering truthfully about how patient can transport, but LCSW would be happy to set up EMS. Patient at this time joined the discussion stating " I like the wheelchair, I don't mind riding in it and have been doing it.  I can ride in the car, it will be fine".  She stated this several times.   Son was quiet but finally reports they will take a car.  Reports they will need assistance getting patient into wheelchair. There are three family members in room and LCSW also explained volunteer services and CNA can also assist with this process and when they arrive at [facility] someone could help them. Son agreeable to plan. He is wanting to proceed with discharge to SNF in family car. RN has been updated. MD has already been in room to address all concerns related to medical work up that son had as well. Family declines any other needs at this time.  Patient is medically stable for discharge to [facility] Patient will transport by EMS. Facility aware of discharge and has been sent all paperwork NO barriers to DC, family to pay privately. Daughter aware of plan, patient to arrive at SNF at any time. Dondra Spry confirms family will be here at 10:30 to pick up patient for discharge.   [facility]  CLINICAL SOCIAL WORK PLACEMENT  NOTE  Date:  07/07/2017  Patient Details  Name: Leslie Lamb MRN: [id number] Date of Birth: 1923/11/06  Clinical Social Work is seeking post-discharge placement for this patient at the Skilled  Nursing Facility level of care (*CSW will initial, date and re-position this form in  chart as items are completed):  Yes   Patient/family provided with [place] Clinical Social Work Department's list of facilities offering this level of care within the geographic area requested by the patient (or if unable, by the patient's family).  Yes   Patient/family informed of their freedom to choose among providers that offer the needed level of care, that participate in Medicare, Medicaid or managed care program needed by the patient, have an available bed and are willing to accept the patient.  Yes   Patient/family informed of [place]'s ownership interest in [facility] and [facility], as well as of the fact that they are under no obligation to receive care at these facilities.  PASRR submitted to EDS on       PASRR number received on       Existing PASRR number confirmed on 07/07/17     FL2 transmitted to all facilities in geographic area requested by pt/family on 07/07/17     FL2 transmitted to all facilities within larger geographic area on  Patient informed that his/her managed care company has contracts with or will negotiate with certain facilities, including the following:        Yes   Patient/family informed of bed offers received.  Patient chooses bed at [facility]     Physician recommends and patient chooses bed at [facility]    Patient to be transferred to [facility] on 07/07/17.  Patient to be transferred to facility by Family car     Patient family notified on 07/07/17 of transfer.  Name of family member notified:  Daughter     PHYSICIAN Please sign FL2      Additional Comment:    _______________________________________________ Raye Sorrow, LCSW 07/07/2017, 9:28 AM

## 2017-07-07 NOTE — Progress Notes (Signed)
[facility] - [place] at [facility]   PATIENT NAME: Leslie Lamb    MR#:  [id number]  DATE OF BIRTH:  12/01/1923  SUBJECTIVE:  CHIEF COMPLAINT:   Chief Complaint  Patient presents with  . Weakness   Feels better Right shoulder pain is chronic  REVIEW OF SYSTEMS:    Review of Systems  Constitutional: Positive for malaise/fatigue. Negative for chills and fever.  HENT: Negative for sore throat.   Eyes: Negative for blurred vision, double vision and pain.  Respiratory: Negative for cough, hemoptysis, shortness of breath and wheezing.   Cardiovascular: Negative for chest pain, palpitations, orthopnea and leg swelling.  Gastrointestinal: Negative for abdominal pain, constipation, diarrhea, heartburn, nausea and vomiting.  Genitourinary: Negative for dysuria and hematuria.  Musculoskeletal: Positive for joint pain. Negative for back pain.  Skin: Negative for rash.  Neurological: Positive for weakness. Negative for sensory change, speech change, focal weakness and headaches.  Endo/Heme/Allergies: Does not bruise/bleed easily.  Psychiatric/Behavioral: Negative for depression. The patient is not nervous/anxious.     DRUG ALLERGIES:   Allergies  Allergen Reactions  . Ciprofloxacin Nausea Only  . Macrodantin  [Nitrofurantoin Macrocrystal]   . Penicillin V Potassium Other (See Comments)  . Sulfa Antibiotics Hives  . Nitrofurantoin Nausea Only, Other (See Comments) and Nausea And Vomiting    VITALS:  Blood pressure (!) 145/72, pulse 90, temperature 99.4 F (37.4 C), temperature source Oral, resp. rate 15, height 5\' 4"  (1.626 m), weight 61.4 kg (135 lb 6.4 oz), SpO2 98 %.  PHYSICAL EXAMINATION:   Physical Exam  GENERAL:  84 y.o.-year-old patient lying in the bed with no acute distress.  EYES: Pupils equal, round, reactive to light and accommodation. No scleral icterus. Extraocular muscles intact.  HEENT: Head atraumatic, normocephalic. Oropharynx and  nasopharynx clear.  NECK:  Supple, no jugular venous distention. No thyroid enlargement, no tenderness.  LUNGS: Normal breath sounds bilaterally, no wheezing, rales, rhonchi. No use of accessory muscles of respiration.  CARDIOVASCULAR: S1, S2 normal. No murmurs, rubs, or gallops.  ABDOMEN: Soft, nontender, nondistended. Bowel sounds present. No organomegaly or mass.  EXTREMITIES: No cyanosis, clubbing or edema b/l.    NEUROLOGIC: Cranial nerves II through XII are intact. No focal Motor or sensory deficits b/l.   PSYCHIATRIC: The patient is alert and awake. SKIN: No obvious rash, lesion, or ulcer.   LABORATORY PANEL:   CBC Recent Labs  Lab 07/06/17 0403  WBC 9.2  HGB 9.5*  HCT 28.1*  PLT 146*   ------------------------------------------------------------------------------------------------------------------ Chemistries  Recent Labs  Lab 07/05/17 1714 07/06/17 0403  NA 136 139  K 3.7 3.2*  CL 101 105  CO2 27 21*  GLUCOSE 116* 91  BUN 21* 18  CREATININE 0.92 0.82  CALCIUM 8.6* 7.8*  AST 44*  --   ALT 21  --   ALKPHOS 58  --   BILITOT 0.9  --    ------------------------------------------------------------------------------------------------------------------  Cardiac Enzymes Recent Labs  Lab 07/05/17 1714  TROPONINI 0.03*   ------------------------------------------------------------------------------------------------------------------  RADIOLOGY:  Dg Chest 2 View  Result Date: 07/05/2017 CLINICAL DATA:  Weakness for 4 days. EXAM: CHEST  2 VIEW COMPARISON:  05/14/2017 and prior chest radiographs FINDINGS: The cardiomediastinal silhouette is unremarkable. There is no evidence of focal airspace disease, pulmonary edema, suspicious pulmonary nodule/mass, pleural effusion, or pneumothorax. No acute bony abnormalities are identified. Degenerative changes within both shoulders again noted. Surgical changes within the UPPER lumbar spine identified. IMPRESSION: No active  cardiopulmonary disease. Electronically Signed   By:  Harmon Pier M.D.   On: 07/05/2017 17:39   Dg Shoulder Right  Result Date: 07/05/2017 CLINICAL DATA:  Acute RIGHT shoulder pain following fall 3 days ago. Initial encounter. EXAM: RIGHT SHOULDER - 2+ VIEW COMPARISON:  05/14/2017 and prior chest radiographs FINDINGS: There is no evidence of acute fracture or dislocation. Degenerative changes within the RIGHT shoulder are again noted. The shoulder has a similar appearance to prior radiographs. IMPRESSION: No evidence of acute abnormality.  Degenerative changes. Electronically Signed   By: Harmon Pier M.D.   On: 07/05/2017 17:41     ASSESSMENT AND PLAN:   84 year old female with past medical history of hypertension, hyperlipidemia, dementia, history of rheumatoid arthritis presents to the hospital due to weakness, fall.  *UTI.  Cultures pending.  Afebrile today.  Continue IV ceftriaxone.  *Generalized weakness and fall.  Due to UTI and progressive worsening with age.  Physical therapy evaluation.  Home with home health versus skilled nursing facility.  * History of rheumatoid arthritis-continue methotrexate, prednisone.  * Essential hypertension-continue atenolol, losartan.  * Dementia-continue Aricept. Monitor for inpatient delirium    All the records are reviewed and case discussed with Care Management/Social Workerr. Management plans discussed with the patient, family and they are in agreement.  CODE STATUS: DO NOT RESUSCITATE  DVT Prophylaxis: SCDs  TOTAL TIME TAKING CARE OF THIS PATIENT: 30 minutes.   POSSIBLE D/C IN 1-2 DAYS, DEPENDING ON CLINICAL CONDITION.  Molinda Bailiff Silvestre Mines M.D on 07/07/2017 at 8:29 AM  Between 7am to 6pm - Pager - [phone]  After 6pm go to [email or web address] - password [email or web address] [facility]  [facility] [place] [facility]  Office  [phone]  CC: Primary care physician; Gracelyn Nurse, MD  Note: This dictation was prepared with Dragon dictation along  with smaller phrase technology. Any transcriptional errors that result from this process are unintentional.

## 2017-07-08 LAB — URINE CULTURE

## 2017-07-10 LAB — CULTURE, BLOOD (ROUTINE X 2)
Culture: NO GROWTH
Culture: NO GROWTH
Special Requests: ADEQUATE

## 2017-08-05 IMAGING — CR DG LUMBAR SPINE COMPLETE 4+V
1 series · 4 of 4 positions shown · non-contrast
Comparison: CT scan of August 25, 2008.

CLINICAL DATA: Low back pain after fall 2 weeks ago.

EXAM:
LUMBAR SPINE - COMPLETE 4+ VIEW

[Series 2: t lumbar spine ap · 0.14mm/px · 4 of 4 slices shown]
[im 1/4]
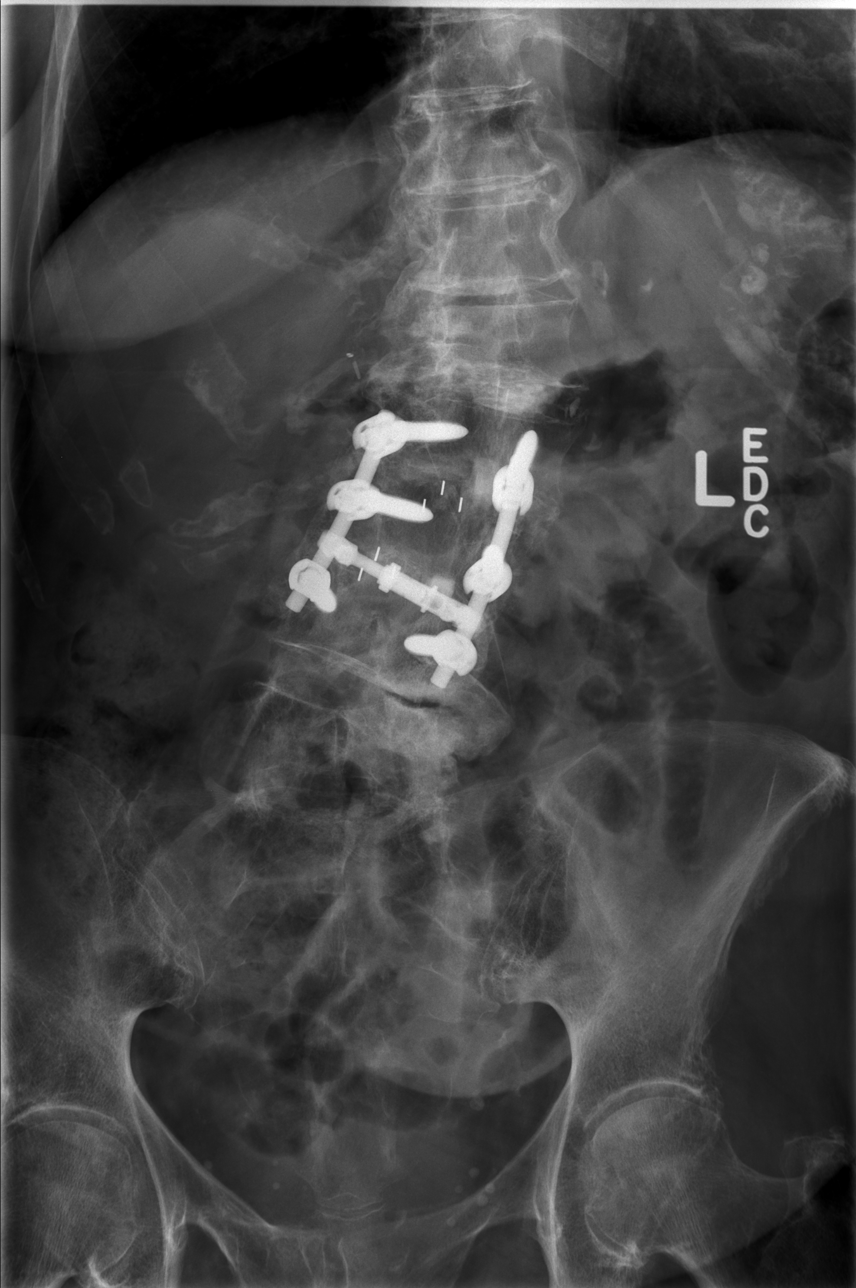
[im 2/4]
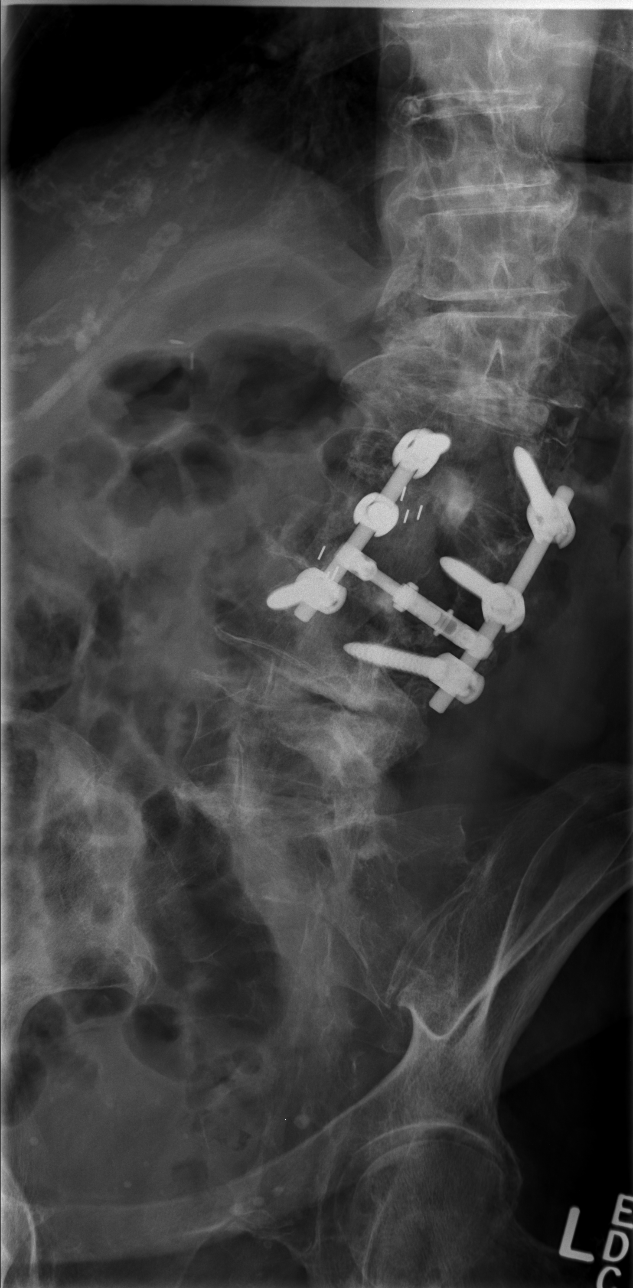
[im 3/4]
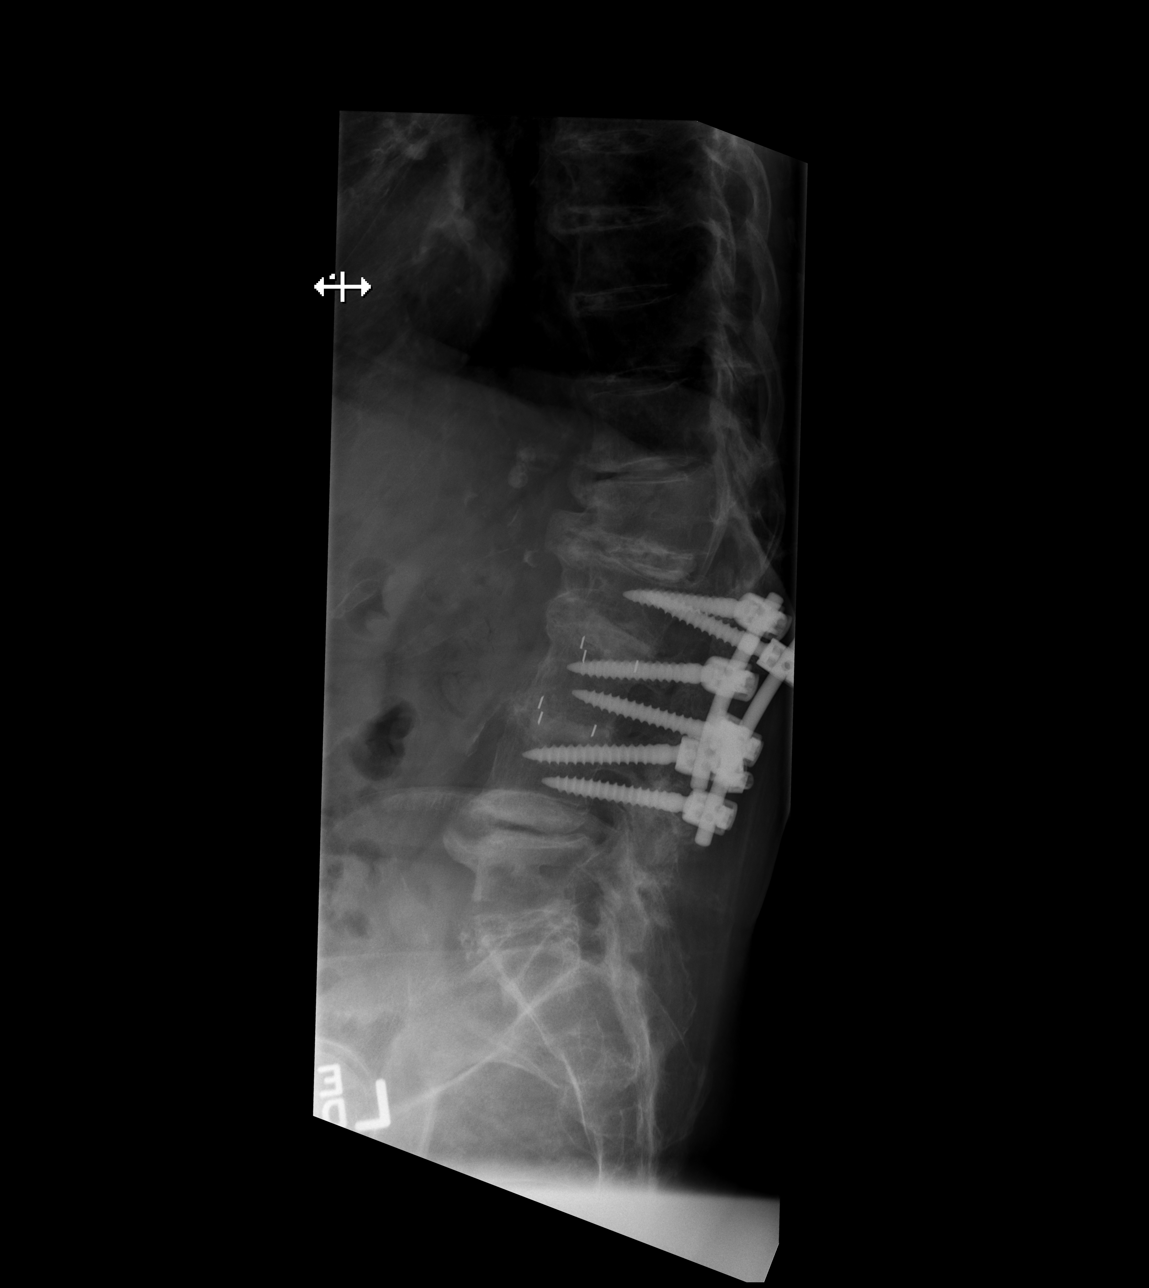
[im 4/4]
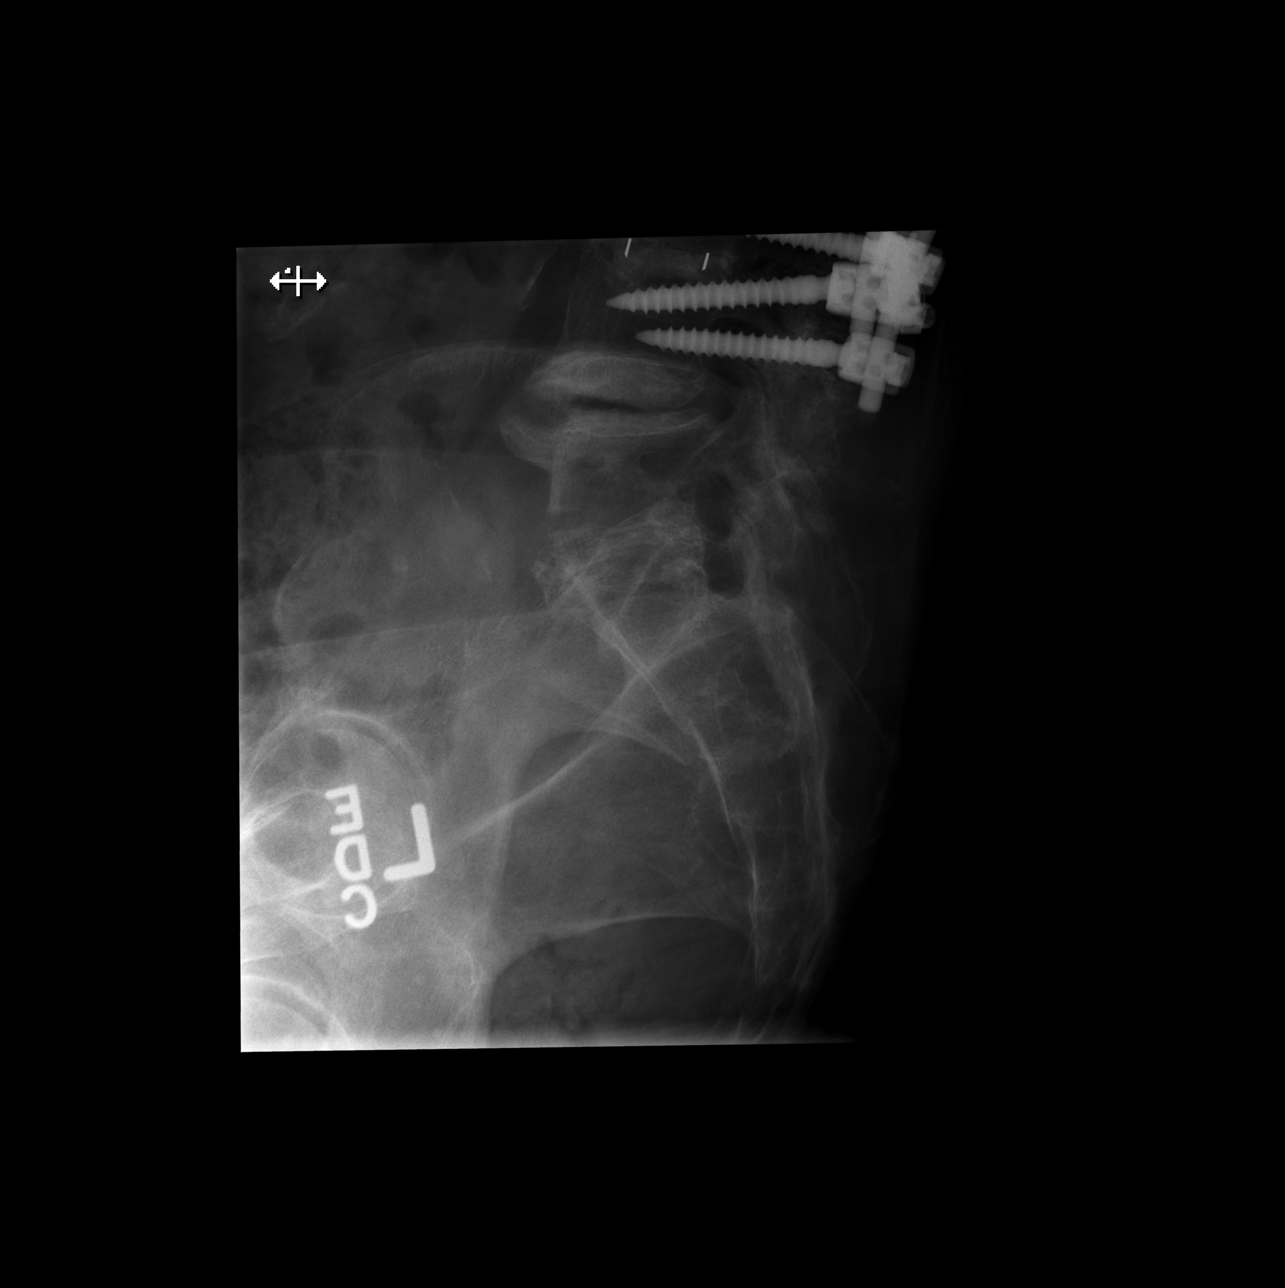

[4 of 4 positions shown; findings below may reference images not displayed]

FINDINGS: Status post surgical posterior fusion of L2, L3 and L4 with
bilateral intrapedicular screw placement. No fracture or
spondylolisthesis is noted. Severe degenerative disc disease is
noted throughout the lumbar spine.
IMPRESSION: Postsurgical and degenerative changes as described above. No acute
abnormality seen in the lumbar spine.

## 2017-09-09 DEATH — deceased
